# Patient Record
Sex: Male | Born: 1955 | Race: White | Hispanic: No | Marital: Married | Smoking: Current every day smoker
Health system: Southern US, Community
[De-identification: ages and names within clinical notes are randomized; demographics above are authoritative.]

## PROBLEM LIST (undated history)

## (undated) DIAGNOSIS — T7840XA Allergy, unspecified, initial encounter: Secondary | ICD-10-CM

## (undated) DIAGNOSIS — F419 Anxiety disorder, unspecified: Secondary | ICD-10-CM

## (undated) HISTORY — DX: Anxiety disorder, unspecified: F41.9

## (undated) HISTORY — PX: KIDNEY STONE SURGERY: SHX686

## (undated) HISTORY — DX: Allergy, unspecified, initial encounter: T78.40XA

---

## 2002-05-28 ENCOUNTER — Encounter: Payer: Self-pay | Admitting: Emergency Medicine

## 2002-05-28 ENCOUNTER — Emergency Department (HOSPITAL_COMMUNITY): Admission: EM | Admit: 2002-05-28 | Discharge: 2002-05-28 | Payer: Self-pay | Admitting: Emergency Medicine

## 2003-07-23 ENCOUNTER — Ambulatory Visit (HOSPITAL_COMMUNITY): Admission: RE | Admit: 2003-07-23 | Discharge: 2003-07-23 | Payer: Self-pay | Admitting: Family Medicine

## 2003-07-24 ENCOUNTER — Ambulatory Visit (HOSPITAL_COMMUNITY): Admission: RE | Admit: 2003-07-24 | Discharge: 2003-07-24 | Payer: Self-pay | Admitting: Family Medicine

## 2004-10-17 ENCOUNTER — Ambulatory Visit (HOSPITAL_COMMUNITY): Admission: RE | Admit: 2004-10-17 | Discharge: 2004-10-17 | Payer: Self-pay | Admitting: Family Medicine

## 2008-03-07 ENCOUNTER — Ambulatory Visit (HOSPITAL_COMMUNITY): Admission: EM | Admit: 2008-03-07 | Discharge: 2008-03-07 | Payer: Self-pay | Admitting: Emergency Medicine

## 2010-09-13 ENCOUNTER — Encounter: Payer: Self-pay | Admitting: Otolaryngology

## 2010-10-17 ENCOUNTER — Emergency Department (HOSPITAL_COMMUNITY): Payer: Self-pay

## 2010-10-17 ENCOUNTER — Emergency Department (HOSPITAL_COMMUNITY)
Admission: EM | Admit: 2010-10-17 | Discharge: 2010-10-18 | Discharge status: Against Medical Advice | Payer: Self-pay | Attending: Emergency Medicine | Admitting: Emergency Medicine

## 2010-10-17 DIAGNOSIS — R42 Dizziness and giddiness: Secondary | ICD-10-CM | POA: Insufficient documentation

## 2010-10-17 DIAGNOSIS — R51 Headache: Secondary | ICD-10-CM | POA: Insufficient documentation

## 2010-10-17 DIAGNOSIS — R112 Nausea with vomiting, unspecified: Secondary | ICD-10-CM | POA: Insufficient documentation

## 2010-10-17 DIAGNOSIS — Z79899 Other long term (current) drug therapy: Secondary | ICD-10-CM | POA: Insufficient documentation

## 2010-10-17 DIAGNOSIS — J3489 Other specified disorders of nose and nasal sinuses: Secondary | ICD-10-CM | POA: Insufficient documentation

## 2010-10-17 LAB — CBC
HCT: 44 % (ref 39.0–52.0)
Hemoglobin: 15.4 g/dL (ref 13.0–17.0)
RBC: 4.78 MIL/uL (ref 4.22–5.81)
RDW: 13.8 % (ref 11.5–15.5)
WBC: 12 10*3/uL — ABNORMAL HIGH (ref 4.0–10.5)

## 2010-10-17 LAB — DIFFERENTIAL
Basophils Absolute: 0.1 10*3/uL (ref 0.0–0.1)
Lymphocytes Relative: 27 % (ref 12–46)
Neutro Abs: 7.7 10*3/uL (ref 1.7–7.7)
Neutrophils Relative %: 64 % (ref 43–77)

## 2010-10-17 LAB — POCT I-STAT, CHEM 8
Chloride: 105 mEq/L (ref 96–112)
HCT: 48 % (ref 39.0–52.0)
Hemoglobin: 16.3 g/dL (ref 13.0–17.0)
Potassium: 4.5 mEq/L (ref 3.5–5.1)
Sodium: 139 mEq/L (ref 135–145)

## 2010-10-20 ENCOUNTER — Other Ambulatory Visit (HOSPITAL_COMMUNITY): Payer: Self-pay

## 2010-10-20 ENCOUNTER — Ambulatory Visit (HOSPITAL_COMMUNITY): Admission: RE | Admit: 2010-10-20 | Payer: Self-pay | Source: Ambulatory Visit

## 2010-10-23 LAB — B. BURGDORFI ANTIBODIES BY WB
B burgdorferi IgG Abs (IB): NEGATIVE
B burgdorferi IgM Abs (IB): NEGATIVE

## 2011-01-06 NOTE — Op Note (Signed)
NAMESEYON, Luke Franco               ACCOUNT NO.:  [id number]   MEDICAL RECORD NO.:  [id number]           PATIENT TYPE:   LOCATION:                                 FACILITY:   PHYSICIAN:  Artist Pais. Weingold, M.D.DATE OF BIRTH:  12/05/1955   DATE OF PROCEDURE:  03/07/2008  DATE OF DISCHARGE:                               OPERATIVE REPORT   PREOPERATIVE DIAGNOSIS:  Left index finger tip amputation with exposed  distal phalangeal bone.   POSTOPERATIVE DIAGNOSIS:  Left index finger tip amputation with exposed  distal phalangeal bone.   ANESTHESIA:  Irrigation and debridement of above with skeletal  shortening and Kutler V-Y flap coverage, left index finger tip  amputation.   SURGEON:  Artist Pais. Mina Marble, MD   ASSISTANT:  None.   Anesthesia was monitored anesthesia care (IV sedation with 0.25% plain  Marcaine digital block).   TOURNIQUET TIME:  80 minutes.   No complications.   No drains.   OPERATIVE REPORT:  The patient was taken to the operating suite.  After  the induction of adequate IV sedation, the patient's left upper  extremity was prepped and draped in sterile fashion.  An Esmarch was  used to exsanguinate the limb.  Tourniquet was then inflated to 250  mmHg.  At this point in time, the patient's left index finger was  debrided.  A liter of normal saline was used to irrigate any devitalized  tissue out.  There was a complex laceration in the nail bed.  This was  transected with a 15 blade until a horizontal surface was created.  The  edge of the distal phalanx that had been fractured was carefully  rongeured down to again a horizontal flat surface using a rongeur.  Once  this was done, there was a small bit of the sterile matrix left.  The  DuraMatrix was intact.  At this point in time, Kutler V-Y lateral flap  was fashioned and raised and taken across into the distal phalanx and  sutured from radial to ulnar, thus covering the exposed distal  phalangeal bone.   This was sutured using 4-0 Vicryl Rapide.  At the end  of the procedure, the patient had no exposed distal phalangeal bone,  appeared to be fairly anatomic-looking eponychial fold and distal fold  round the remaining nail bed.  The patient was dressed with Xeroform, 4  x 4's, and Coban wrap.  The patient tolerated the procedure well and  went to recovery room in stable fashion.      Artist Pais Mina Marble, M.D.  Electronically Signed    MAW/MEDQ  D:  03/07/2008  T:  03/08/2008  Job:  [id number]

## 2011-01-06 NOTE — Consult Note (Signed)
NAMECULLEN, Luke Franco               ACCOUNT NO.:  [id number]   MEDICAL RECORD NO.:  [id number]          PATIENT TYPE:  AMB   LOCATION:  [facility]                         FACILITY:  [facility]   PHYSICIAN:  Artist Pais. Weingold, M.D.DATE OF BIRTH:  [birth year]   DATE OF CONSULTATION:  03/07/2008  DATE OF DISCHARGE:  03/07/2008                                 CONSULTATION   ER CONSULTATION   REQUESTING PHYSICIAN:  Luke Hutching, MD   REASON FOR CONSULTATION:  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  He is an, otherwise,  healthy 55 year old male.  He has no known drug allergies.  He is taking  no current medications.  He smokes 2 packs a day, occasional alcohol  use.  No significant family medical history.   Examination reveals a well-developed, well-nourished male.  Pleasant,  alert, and oriented x3.  He has mild distress and mild discomfort on his  index finger left.  He has a nail plate avulsion, nail bed laceration,  exposed distal phalangeal bone with an amputation through the middle of  the distal phalanx with exposed distal phalangeal bone.   X-rays are consistent with this injury, distal phalangeal fracture.  At  this point in time, I had a thorough and frank discussion with Mr.  Franco regarding treatment options.  Treatment options include revision  of the amputation with skeletal shortening and local flap coverage  versus cross finger flap to the long finger.  The patient is desirous to  get back to work as soon as possible.  Therefore, we will proceed with  skeletal shortening and local Cutler V-Y flap to cover the distal  phalangeal bone.  We will do this in the operating room under IV  sedation and local block at the  patient's request.      Molli Hazard A. Mina Marble, M.D.  Electronically Signed     MAW/MEDQ  D:  03/07/2008  T:  03/08/2008  Job:  [id number]

## 2011-05-22 LAB — POCT I-STAT 4, (NA,K, GLUC, HGB,HCT)
Operator id: 173791
Potassium: 3.8
Sodium: 139

## 2011-11-12 ENCOUNTER — Encounter: Payer: Self-pay | Admitting: Family Medicine

## 2011-11-12 ENCOUNTER — Ambulatory Visit: Payer: Self-pay

## 2011-11-12 ENCOUNTER — Ambulatory Visit: Payer: Self-pay | Admitting: Family Medicine

## 2011-11-12 VITALS — BP 138/87 | HR 91 | Temp 97.5°F | Resp 16 | Ht 70.5 in | Wt 178.4 lb

## 2011-11-12 DIAGNOSIS — R61 Generalized hyperhidrosis: Secondary | ICD-10-CM

## 2011-11-12 MED ORDER — ALPRAZOLAM 0.25 MG PO TABS: 0.2500 mg | ORAL_TABLET | Freq: Every evening | ORAL | Status: AC | PRN

## 2011-11-12 NOTE — Progress Notes (Signed)
S:  Night sweats x 2 months, intermittently.  Relieved by xanax .25   Stressed being out of work.  O:  HEENT: neg Chest:  Clear Heart:  Reg, no murmur Abdomen:  No HSM, nontender Neck, supraclav, axilla: no adenop  [facility] reading (PRIMARY) by  Dr. Milus Glazier  Neg CXR  A:  Anxiety related sx  P:  Xanax .25

## 2011-12-11 ENCOUNTER — Ambulatory Visit: Payer: Self-pay | Admitting: Family Medicine

## 2011-12-11 ENCOUNTER — Encounter: Payer: Self-pay | Admitting: Family Medicine

## 2011-12-11 VITALS — BP 128/79 | HR 80 | Temp 97.4°F | Resp 16 | Ht 71.5 in | Wt 179.6 lb

## 2011-12-11 DIAGNOSIS — R5383 Other fatigue: Secondary | ICD-10-CM

## 2011-12-11 DIAGNOSIS — R5381 Other malaise: Secondary | ICD-10-CM

## 2011-12-11 NOTE — Patient Instructions (Signed)
Lyme Disease  You may have been bitten by a tick and are to watch for the development of Lyme Disease. Lyme Disease is an infection that is caused by a bacteria The bacteria causing this disease is named Borreilia burgdorferi. If a tick is infected with this bacteria and then bites you, then Lyme Disease may occur. These ticks are carried by deer and rodents such as rabbits and mice and infest grassy as well as forested areas. Fortunately most tick bites do not cause Lyme Disease.   Lyme Disease is easier to prevent than to treat. First, covering your legs with clothing when walking in areas where ticks are possibly abundant will prevent their attachment because ticks tend to stay within inches of the ground. Second, using insecticides containing DEET can be applied on skin or clothing. Last, because it takes about 12 to 24 hours for the tick to transmit the disease after attachment to the human host, you should inspect your body for ticks twice a day when you are in areas where Lyme Disease is common. You must look thoroughly when searching for ticks. The Ixodes tick that carries Lyme Disease is very small. It is around the size of a sesame seed (picture of tick is not actual size). Removal is best done by grasping the tick by the head and pulling it out. Do not to squeeze the body of the tick. This could inject the infecting bacteria into the bite site. Wash the area of the bite with an antiseptic solution after removal.   Lyme Disease is a disease that may affect many body systems. Because of the small size of the biting tick, most people do not notice being bitten. The first sign of an infection is usually a round red rash that extends out from the center of the tick bite. The center of the lesion may be blood colored (hemorrhagic) or have tiny blisters (vesicular). Most lesions have bright red outer borders and partial central clearing. This rash may extend out many inches in diameter, and multiple lesions may  be present. Other symptoms such as fatigue, headaches, chills and fever, general achiness and swelling of lymph glands may also occur. If this first stage of the disease is left untreated, these symptoms may gradually resolve by themselves, or progressive symptoms may occur because of spread of infection to other areas of the body.   Follow up with your caregiver to have testing and treatment if you have a tick bite and you develop any of the above complaints. Your caregiver may recommend preventative (prophylactic) medications which kill bacteria (antibiotics). Once a diagnosis of Lyme Disease is made, antibiotic treatment is highly likely to cure the disease. Effective treatment of late stage Lyme Disease may require longer courses of antibiotic therapy.   MAKE SURE YOU:    Understand these instructions.   Will watch your condition.   Will get help right away if you are not doing well or get worse.  Document Released: 11/16/2000 Document Revised: 07/30/2011 Document Reviewed: 01/18/2009  [employer] Patient Information 2012 [employer].

## 2011-12-12 LAB — CBC
HCT: 45.1 % (ref 39.0–52.0)
Hemoglobin: 15.1 g/dL (ref 13.0–17.0)
MCH: 31.3 pg (ref 26.0–34.0)
MCHC: 33.5 g/dL (ref 30.0–36.0)
MCV: 93.4 fL (ref 78.0–100.0)
Platelets: 249 10*3/uL (ref 150–400)
RBC: 4.83 MIL/uL (ref 4.22–5.81)
RDW: 13.9 % (ref 11.5–15.5)
WBC: 7.7 10*3/uL (ref 4.0–10.5)

## 2011-12-12 LAB — COMPREHENSIVE METABOLIC PANEL
ALT: 20 U/L (ref 0–53)
AST: 20 U/L (ref 0–37)
Albumin: 4.6 g/dL (ref 3.5–5.2)
Alkaline Phosphatase: 84 U/L (ref 39–117)
BUN: 10 mg/dL (ref 6–23)
CO2: 24 mEq/L (ref 19–32)
Calcium: 9.6 mg/dL (ref 8.4–10.5)
Chloride: 105 mEq/L (ref 96–112)
Creat: 0.98 mg/dL (ref 0.50–1.35)
Glucose, Bld: 91 mg/dL (ref 70–99)
Potassium: 4.2 mEq/L (ref 3.5–5.3)
Sodium: 138 mEq/L (ref 135–145)
Total Bilirubin: 0.4 mg/dL (ref 0.3–1.2)
Total Protein: 7.2 g/dL (ref 6.0–8.3)

## 2011-12-12 LAB — TSH: TSH: 0.846 u[IU]/mL (ref 0.350–4.500)

## 2011-12-12 NOTE — Progress Notes (Signed)
Is a 56 year old married man with a history of Lyme disease several years ago. Comes in with recurrence of fatigue over the last month. His like to have this evaluated in case there is a recurrence of Lyme disease. He denies any rash or joint pains at this time. He's had no headaches or fever.  Objective HEENT: Unremarkable patient is in no acute distress or wrap chest: Clear  Heart: Regular no murmur  Abdomen: No rash at the previous site, HSM unenlarged, no masses or tenderness  Extremities: Unremarkable  Assessment: Patient's recently returned to work and some of the fatigue may have something to do with this. His wife is chronically ill and is also a stressor.  Plan: Check labs to rule out underlying causes of fatigue.

## 2011-12-14 LAB — B. BURGDORFI ANTIBODIES: B burgdorferi Ab IgG+IgM: 0.67 {ISR}

## 2012-01-29 ENCOUNTER — Ambulatory Visit: Payer: Self-pay | Admitting: Family Medicine

## 2012-01-29 VITALS — BP 128/80 | HR 67 | Temp 98.1°F | Resp 16 | Ht 71.0 in | Wt 182.0 lb

## 2012-01-29 DIAGNOSIS — S20169A Insect bite (nonvenomous) of breast, unspecified breast, initial encounter: Secondary | ICD-10-CM

## 2012-01-29 DIAGNOSIS — N4822 Cellulitis of corpus cavernosum and penis: Secondary | ICD-10-CM

## 2012-01-29 DIAGNOSIS — L089 Local infection of the skin and subcutaneous tissue, unspecified: Secondary | ICD-10-CM

## 2012-01-29 NOTE — Progress Notes (Signed)
Is a 56 year old man who found a tick on his penis, the frenulum part, 40s ago. He is able to smaller with petroleum jelly and then eventually extricate the pincers. He's had a great amount of itching, localized redness and crusty exudate at the area of the bite.  Objective: Mild cellulitis the frenulum of the ventral penis. Minimal erythema as well as crusting.     assessment: Tick bite with localized cellulitis  Plan: Doxycycline 100 mg twice a day x10 days

## 2012-03-16 ENCOUNTER — Telehealth: Payer: Self-pay

## 2012-03-16 NOTE — Telephone Encounter (Signed)
Please contact pt wife she only wants to speak to Dr Elbert Ewings [phone]

## 2012-03-17 NOTE — Telephone Encounter (Signed)
Patient's wife called back again and only wants to speak to Dr L. She states Dr. Elbert Ewings  cannot be her physician anymore, but she would like him to see her husband still.  Maryelizabeth Rowan [phone]

## 2012-11-07 ENCOUNTER — Ambulatory Visit: Payer: Self-pay | Admitting: Physician Assistant

## 2012-11-07 VITALS — BP 120/70 | HR 64 | Temp 98.5°F | Resp 16 | Ht 71.0 in | Wt 162.0 lb

## 2012-11-07 DIAGNOSIS — J069 Acute upper respiratory infection, unspecified: Secondary | ICD-10-CM

## 2012-11-07 MED ORDER — CETIRIZINE HCL 10 MG PO TABS: 10.0000 mg | ORAL_TABLET | Freq: Every day | ORAL | Status: DC

## 2012-11-07 MED ORDER — BENZONATATE 100 MG PO CAPS: 100.0000 mg | ORAL_CAPSULE | Freq: Three times a day (TID) | ORAL | Status: DC | PRN

## 2012-11-07 MED ORDER — IPRATROPIUM BROMIDE 0.03 % NA SOLN: 2.0000 | Freq: Two times a day (BID) | NASAL | Status: DC

## 2012-11-07 NOTE — Patient Instructions (Addendum)
Begin using the Zyrtec (cetirizine) once daily to help with nasal congestion and post-nasal drainage.  Atrovent nasal spray twice daily to help relieve nasal congestion and ear pressure.  Tessalon Perles for cough if needed.  Plenty of fluids (water is best!) and rest.  If you are worsening (fever >101.81F, worsening pain, worsening cough) or not improving in 4-5 days, please call us and we can send an antibiotic in for you.   Upper Respiratory Infection, Adult An upper respiratory infection (URI) is also sometimes known as the common cold. The upper respiratory tract includes the nose, sinuses, throat, trachea, and bronchi. Bronchi are the airways leading to the lungs. Most people improve within 1 week, but symptoms can last up to 2 weeks. A residual cough may last even longer.  CAUSES Many different viruses can infect the tissues lining the upper respiratory tract. The tissues become irritated and inflamed and often become very moist. Mucus production is also common. A cold is contagious. You can easily spread the virus to others by oral contact. This includes kissing, sharing a glass, coughing, or sneezing. Touching your mouth or nose and then touching a surface, which is then touched by another person, can also spread the virus. SYMPTOMS  Symptoms typically develop 1 to 3 days after you come in contact with a cold virus. Symptoms vary from person to person. They may include:  Runny nose.  Sneezing.  Nasal congestion.  Sinus irritation.  Sore throat.  Loss of voice (laryngitis).  Cough.  Fatigue.  Muscle aches.  Loss of appetite.  Headache.  Low-grade fever. DIAGNOSIS  You might diagnose your own cold based on familiar symptoms, since most people get a cold 2 to 3 times a year. Your caregiver can confirm this based on your exam. Most importantly, your caregiver can check that your symptoms are not due to another disease such as strep throat, sinusitis, pneumonia, asthma, or  epiglottitis. Blood tests, throat tests, and X-rays are not necessary to diagnose a common cold, but they may sometimes be helpful in excluding other more serious diseases. Your caregiver will decide if any further tests are required. RISKS AND COMPLICATIONS  You may be at risk for a more severe case of the common cold if you smoke cigarettes, have chronic heart disease (such as heart failure) or lung disease (such as asthma), or if you have a weakened immune system. The very young and very old are also at risk for more serious infections. Bacterial sinusitis, middle ear infections, and bacterial pneumonia can complicate the common cold. The common cold can worsen asthma and chronic obstructive pulmonary disease (COPD). Sometimes, these complications can require emergency medical care and may be life-threatening. PREVENTION  The best way to protect against getting a cold is to practice good hygiene. Avoid oral or hand contact with people with cold symptoms. Wash your hands often if contact occurs. There is no clear evidence that vitamin C, vitamin E, echinacea, or exercise reduces the chance of developing a cold. However, it is always recommended to get plenty of rest and practice good nutrition. TREATMENT  Treatment is directed at relieving symptoms. There is no cure. Antibiotics are not effective, because the infection is caused by a virus, not by bacteria. Treatment may include:  Increased fluid intake. Sports drinks offer valuable electrolytes, sugars, and fluids.  Breathing heated mist or steam (vaporizer or shower).  Eating chicken soup or other clear broths, and maintaining good nutrition.  Getting plenty of rest.  Using gargles or  lozenges for comfort.  Controlling fevers with ibuprofen or acetaminophen as directed by your caregiver.  Increasing usage of your inhaler if you have asthma. Zinc gel and zinc lozenges, taken in the first 24 hours of the common cold, can shorten the duration  and lessen the severity of symptoms. Pain medicines may help with fever, muscle aches, and throat pain. A variety of non-prescription medicines are available to treat congestion and runny nose. Your caregiver can make recommendations and may suggest nasal or lung inhalers for other symptoms.  HOME CARE INSTRUCTIONS   Only take over-the-counter or prescription medicines for pain, discomfort, or fever as directed by your caregiver.  Use a warm mist humidifier or inhale steam from a shower to increase air moisture. This may keep secretions moist and make it easier to breathe.  Drink enough water and fluids to keep your urine clear or pale yellow.  Rest as needed.  Return to work when your temperature has returned to normal or as your caregiver advises. You may need to stay home longer to avoid infecting others. You can also use a face mask and careful hand washing to prevent spread of the virus. SEEK MEDICAL CARE IF:   After the first few days, you feel you are getting worse rather than better.  You need your caregiver's advice about medicines to control symptoms.  You develop chills, worsening shortness of breath, or brown or red sputum. These may be signs of pneumonia.  You develop yellow or brown nasal discharge or pain in the face, especially when you bend forward. These may be signs of sinusitis.  You develop a fever, swollen neck glands, pain with swallowing, or white areas in the back of your throat. These may be signs of strep throat. SEEK IMMEDIATE MEDICAL CARE IF:   You have a fever.  You develop severe or persistent headache, ear pain, sinus pain, or chest pain.  You develop wheezing, a prolonged cough, cough up blood, or have a change in your usual mucus (if you have chronic lung disease).  You develop sore muscles or a stiff neck. Document Released: 02/03/2001 Document Revised: 11/02/2011 Document Reviewed: 12/12/2010 [facility] Patient Information 2013 [place], [place].

## 2012-11-07 NOTE — Progress Notes (Signed)
  Subjective:    Patient ID: JAYSTON TREVINO, male    DOB: 13-Sep-1955, 58 y.o.   MRN: [id number]  HPI  Mr. Grabill is a very pleasant 57 yr old male here with concern for sinusitis.  States he has been told that he has chronic sinusitis.  Would like "pills and a shot" today. Currently experiencing itchy throat, popping ears, and nasal congestion.  Also with some non-productive cough.  Chills but no fever.  A little nausea and decreased appetite but no vomiting or diarrhea.  Symptoms have been present for 2 days.  No known sick contacts.  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  He currently smokes 1ppd (down from 2 ppd for many years).      Review of Systems  Constitutional: Positive for chills. Negative for fever.  HENT: Positive for ear pain, congestion, sore throat and rhinorrhea.   Respiratory: Positive for cough and wheezing. Negative for shortness of breath.   Cardiovascular: Negative.   Gastrointestinal: Negative.   Skin: Negative.   Neurological: Negative.        Objective:   Physical Exam  Vitals reviewed. Constitutional: He is oriented to person, place, and time. He appears well-developed and well-nourished. No distress.  HENT:  Head: Normocephalic and atraumatic.  Right Ear: Ear canal normal. Tympanic membrane is not erythematous and not bulging. A middle ear effusion is present.  Left Ear: Tympanic membrane and ear canal normal.  Nose: Mucosal edema present. Right sinus exhibits no maxillary sinus tenderness and no frontal sinus tenderness. Left sinus exhibits no maxillary sinus tenderness and no frontal sinus tenderness.  Mouth/Throat: Uvula is midline, oropharynx is clear and moist and mucous membranes are normal.  Eyes: Conjunctivae are normal. No scleral icterus.  Neck: Neck supple.  Cardiovascular: Normal rate, regular rhythm and normal heart sounds.  Exam reveals no gallop and no friction rub.   No murmur heard. Pulmonary/Chest: Effort  normal and breath sounds normal. He has no wheezes. He has no rales.  Lymphadenopathy:    He has no cervical adenopathy.  Neurological: He is alert and oriented to person, place, and time.  Skin: Skin is warm and dry.  Psychiatric: He has a normal mood and affect. His behavior is normal.     Filed Vitals:   11/07/12 1257  BP: 120/70  Pulse: 64  Temp: 98.5 F (36.9 C)  Resp: 16        Assessment & Plan:  Upper respiratory infection - Plan: cetirizine (ZYRTEC) 10 MG tablet, ipratropium (ATROVENT) 0.03 % nasal spray, benzonatate (TESSALON) 100 MG capsule   Mr. Janoski is a very pleasant 57 yr old male here with upper respiratory infection.  He is afebrile, VSS, lungs CTA, no sinus tenderness.  Symptoms have been present for 2 days.  I do not think antibiotics are warranted at this point.  Pt is very disappointed to hear this.  Discussed with him the guidelines for diagnosing bacterial sinusitis, and that this is likely viral and that antibiotics will not improve symptoms.  Will treat symptoms with cetirizine, atrovent, and tessalon.  Push fluids, plenty of rest.  If pt is worsening or not improving in 4-5 days, he will call back and we can send Augmentin BID x 10 days for sinusitis.  Pt understands and is grateful for this.

## 2013-06-30 ENCOUNTER — Ambulatory Visit: Payer: Self-pay | Admitting: Family Medicine

## 2013-06-30 VITALS — BP 134/80 | HR 76 | Temp 98.1°F | Resp 18 | Ht 72.0 in | Wt 151.8 lb

## 2013-06-30 DIAGNOSIS — W57XXXA Bitten or stung by nonvenomous insect and other nonvenomous arthropods, initial encounter: Secondary | ICD-10-CM

## 2013-06-30 DIAGNOSIS — Z720 Tobacco use: Secondary | ICD-10-CM | POA: Insufficient documentation

## 2013-06-30 DIAGNOSIS — T148 Other injury of unspecified body region: Secondary | ICD-10-CM

## 2013-06-30 MED ORDER — DOXYCYCLINE HYCLATE 100 MG PO TABS: 100.0000 mg | ORAL_TABLET | Freq: Two times a day (BID) | ORAL | Status: DC

## 2013-06-30 NOTE — Patient Instructions (Signed)
Use the doxycycline as directed for possible lyme disease.  Let us know if you are not feeling better soon

## 2013-06-30 NOTE — Progress Notes (Signed)
[facility] [address], [place] [address] [phone]  Date:  06/30/2013   Name:  Luke Franco   DOB:  11-Feb-1956   MRN:  [id number]  PCP:  Elvina Sidle, MD    Chief Complaint: Tick Removal   History of Present Illness:  Luke Franco is a 57 y.o. very pleasant male patient who presents with the following:  He noted a tick on his left chest this morning.  He is not sure how long it has ben there.   He has felt "kind of funny" for 3 or 4 days to a week.  He has felt nauseated, no vomiting.  No fever.  No has not noted any rash He has had lyme disease in the past apparently about 2 years ago He notes a mild headache and feels a little bit dizzy.  There are no active problems to display for this patient.   History reviewed. No pertinent past medical history.  History reviewed. No pertinent past surgical history.  History  Substance Use Topics  . Smoking status: Current Every Day Smoker -- 1.00 packs/day for 25 years    Types: Cigarettes  . Smokeless tobacco: Not on file  . Alcohol Use: No    Family History  Problem Relation Age of Onset  . Diabetes Mother   . Diabetes Brother     No Known Allergies  Medication list has been reviewed and updated.  Current Outpatient Prescriptions on File Prior to Visit  Medication Sig Dispense Refill  . benzonatate (TESSALON) 100 MG capsule Take 1-2 capsules (100-200 mg total) by mouth 3 (three) times daily as needed for cough.  40 capsule  0  . cetirizine (ZYRTEC) 10 MG tablet Take 1 tablet (10 mg total) by mouth daily.  30 tablet  11  . ipratropium (ATROVENT) 0.03 % nasal spray Place 2 sprays into the nose 2 (two) times daily.  30 mL  1   No current facility-administered medications on file prior to visit.    Review of Systems:  As per HPI- otherwise negative.   Physical Examination: Filed Vitals:   06/30/13 1754  BP: 134/80  Pulse: 76  Temp: 98.1 F (36.7 C)  Resp: 18   Filed  Vitals:   06/30/13 1754  Height: 6' (1.829 m)  Weight: 151 lb 12.8 oz (68.856 kg)   Body mass index is 20.58 kg/(m^2). Ideal Body Weight: Weight in (lb) to have BMI = 25: 183.9  GEN: WDWN, NAD, Non-toxic, A & O x 3, thin, looks well but smells strongly of tobacco HEENT: Atraumatic, Normocephalic. Neck supple. No masses, No LAD.  Bilateral TM wnl, oropharynx normal.  PEERL,EOMI.   Ears and Nose: No external deformity. CV: RRR, No M/G/R. No JVD. No thrill. No extra heart sounds. PULM: CTA B, no wheezes, crackles, rhonchi. No retractions. No resp. distress. No accessory muscle use. ABD: S, NT, ND EXTR: No c/c/e NEURO Normal gait. Normal UE and LE strength, sensation and DTR.   PSYCH: Normally interactive. Conversant. Not depressed or anxious appearing.  Calm demeanor.  posible small tick bite in his left chest.    Assessment and Plan: Tick bite - Plan: doxycycline (VIBRA-TABS) 100 MG tablet  Pt is concerned about lyme disease.   Although true lyme is unlikely given our geographical location testing is expensive, he is uninsured and is concerned about lyme.  Will treat with doxycycline for 2 weeks.  He is to let us know if not feeling better  soon- Sooner if worse.     Signed Abbe AmsterdamJessica Enoch Moffa, MD

## 2013-07-25 ENCOUNTER — Ambulatory Visit: Payer: Self-pay | Admitting: Family Medicine

## 2013-07-25 VITALS — BP 128/70 | HR 65 | Temp 98.2°F | Resp 18 | Ht 72.0 in | Wt 152.0 lb

## 2013-07-25 DIAGNOSIS — J329 Chronic sinusitis, unspecified: Secondary | ICD-10-CM

## 2013-07-25 DIAGNOSIS — J209 Acute bronchitis, unspecified: Secondary | ICD-10-CM

## 2013-07-25 MED ORDER — HYDROCODONE-HOMATROPINE 5-1.5 MG/5ML PO SYRP: 5.0000 mL | ORAL_SOLUTION | Freq: Three times a day (TID) | ORAL | Status: DC | PRN

## 2013-07-25 MED ORDER — AMOXICILLIN 875 MG PO TABS: 875.0000 mg | ORAL_TABLET | Freq: Two times a day (BID) | ORAL | Status: DC

## 2013-07-25 NOTE — Progress Notes (Signed)
Patient ID: ORVELL CAREAGA MRN: [id number], DOB: November 17, 1955, 57 y.o. Date of Encounter: 07/25/2013, 3:34 PM  Primary Physician: Elvina Sidle, MD  Chief Complaint:  Chief Complaint  Patient presents with  . flu like sx    headaches, chills, cough, itchy throat, dizziness behind eyes, head pressure since friday     HPI: 57 y.o. year old male presents with a 4 day history of nasal congestion, post nasal drip, sore throat, and cough. Mild sinus pressure. Afebrile. No chills. Nasal congestion thick and green/yellow. Cough is productive of green/yellow sputum and not associated with time of day. Ears feel full, leading to sensation of muffled hearing. Has tried OTC cold preps without success. No GI complaints.   No sick contacts, recent antibiotics, or recent travels.   No leg trauma, sedentary periods, h/o cancer, or tobacco use.  History reviewed. No pertinent past medical history.   Home Meds: Prior to Admission medications   Medication Sig Start Date End Date Taking? Authorizing Provider  benzonatate (TESSALON) 100 MG capsule Take 1-2 capsules (100-200 mg total) by mouth 3 (three) times daily as needed for cough. 11/07/12   Eleanore Delia Chimes, PA-C  cetirizine (ZYRTEC) 10 MG tablet Take 1 tablet (10 mg total) by mouth daily. 11/07/12   Eleanore Delia Chimes, PA-C  doxycycline (VIBRA-TABS) 100 MG tablet Take 1 tablet (100 mg total) by mouth 2 (two) times daily. 06/30/13   Gwenlyn Found Copland, MD  ipratropium (ATROVENT) 0.03 % nasal spray Place 2 sprays into the nose 2 (two) times daily. 11/07/12   Eleanore Delia Chimes, PA-C    Allergies: No Known Allergies  History   Social History  . Marital Status: Married    Spouse Name: N/A    Number of Children: N/A  . Years of Education: N/A   Occupational History  . Not on file.   Social History Main Topics  . Smoking status: Current Every Day Smoker -- 1.00 packs/day for 25 years    Types: Cigarettes  . Smokeless tobacco: Not on file  . Alcohol  Use: No  . Drug Use: Not on file  . Sexual Activity: Not on file   Other Topics Concern  . Not on file   Social History Narrative  . No narrative on file     Review of Systems: Constitutional: negative for chills, fever, night sweats or weight changes Cardiovascular: negative for chest pain or palpitations Respiratory: negative for hemoptysis, wheezing, or shortness of breath Abdominal: negative for abdominal pain, nausea, vomiting or diarrhea Dermatological: negative for rash Neurologic: negative for headache   Physical Exam: Blood pressure 128/70, pulse 65, temperature 98.2 F (36.8 C), temperature source Oral, resp. rate 18, height 6' (1.829 m), weight 152 lb (68.947 kg), SpO2 97.00%., Body mass index is 20.61 kg/(m^2). General: Well developed, well nourished, in no acute distress. Head: Normocephalic, atraumatic, eyes without discharge, sclera non-icteric, nares are congested. Bilateral auditory canals clear, TM's are without perforation, pearly grey with reflective cone of light bilaterally. No sinus TTP. Oral cavity moist, dentition normal. Posterior pharynx with post nasal drip and mild erythema. No peritonsillar abscess or tonsillar exudate. Neck: Supple. No thyromegaly. Full ROM. No lymphadenopathy. Lungs: Coarse breath sounds bilaterally without wheezes, rales, or rhonchi. Breathing is unlabored.  Heart: RRR with S1 S2. No murmurs, rubs, or gallops appreciated. Msk:  Strength and tone normal for age. Extremities: No clubbing or cyanosis. No edema. Neuro: Alert and oriented X 3. Moves all extremities spontaneously. CNII-XII grossly in tact. Psych:  Responds to questions appropriately with a normal affect.     ASSESSMENT AND PLAN:  57 y.o. year old male with bronchitis. Acute bronchitis - Plan: amoxicillin (AMOXIL) 875 MG tablet  - -Mucinex -Tylenol/Motrin prn -Rest/fluids -RTC precautions -RTC 3-5 days if no improvement  Signed, Elvina Sidle,  MD 07/25/2013 3:34 PM

## 2013-07-25 NOTE — Patient Instructions (Signed)
Sinusitis Sinusitis is redness, soreness, and swelling (inflammation) of the paranasal sinuses. Paranasal sinuses are air pockets within the bones of your face (beneath the eyes, the middle of the forehead, or above the eyes). In healthy paranasal sinuses, mucus is able to drain out, and air is able to circulate through them by way of your nose. However, when your paranasal sinuses are inflamed, mucus and air can become trapped. This can allow bacteria and other germs to grow and cause infection. Sinusitis can develop quickly and last only a short time (acute) or continue over a long period (chronic). Sinusitis that lasts for more than 12 weeks is considered chronic.  CAUSES  Causes of sinusitis include:  Allergies.  Structural abnormalities, such as displacement of the cartilage that separates your nostrils (deviated septum), which can decrease the air flow through your nose and sinuses and affect sinus drainage.  Functional abnormalities, such as when the small hairs (cilia) that line your sinuses and help remove mucus do not work properly or are not present. SYMPTOMS  Symptoms of acute and chronic sinusitis are the same. The primary symptoms are pain and pressure around the affected sinuses. Other symptoms include:  Upper toothache.  Earache.  Headache.  Bad breath.  Decreased sense of smell and taste.  A cough, which worsens when you are lying flat.  Fatigue.  Fever.  Thick drainage from your nose, which often is green and may contain pus (purulent).  Swelling and warmth over the affected sinuses. DIAGNOSIS  Your caregiver will perform a physical exam. During the exam, your caregiver may:  Look in your nose for signs of abnormal growths in your nostrils (nasal polyps).  Tap over the affected sinus to check for signs of infection.  View the inside of your sinuses (endoscopy) with a special imaging device with a light attached (endoscope), which is inserted into your  sinuses. If your caregiver suspects that you have chronic sinusitis, one or more of the following tests may be recommended:  Allergy tests.  Nasal culture A sample of mucus is taken from your nose and sent to a lab and screened for bacteria.  Nasal cytology A sample of mucus is taken from your nose and examined by your caregiver to determine if your sinusitis is related to an allergy. TREATMENT  Most cases of acute sinusitis are related to a viral infection and will resolve on their own within 10 days. Sometimes medicines are prescribed to help relieve symptoms (pain medicine, decongestants, nasal steroid sprays, or saline sprays).  However, for sinusitis related to a bacterial infection, your caregiver will prescribe antibiotic medicines. These are medicines that will help kill the bacteria causing the infection.  Rarely, sinusitis is caused by a fungal infection. In theses cases, your caregiver will prescribe antifungal medicine. For some cases of chronic sinusitis, surgery is needed. Generally, these are cases in which sinusitis recurs more than 3 times per year, despite other treatments. HOME CARE INSTRUCTIONS   Drink plenty of water. Water helps thin the mucus so your sinuses can drain more easily.  Use a humidifier.  Inhale steam 3 to 4 times a day (for example, sit in the bathroom with the shower running).  Apply a warm, moist washcloth to your face 3 to 4 times a day, or as directed by your caregiver.  Use saline nasal sprays to help moisten and clean your sinuses.  Take over-the-counter or prescription medicines for pain, discomfort, or fever only as directed by your caregiver. SEEK IMMEDIATE MEDICAL   CARE IF:  You have increasing pain or severe headaches.  You have nausea, vomiting, or drowsiness.  You have swelling around your face.  You have vision problems.  You have a stiff neck.  You have difficulty breathing. MAKE SURE YOU:   Understand these  instructions.  Will watch your condition.  Will get help right away if you are not doing well or get worse. Document Released: 08/10/2005 Document Revised: 11/02/2011 Document Reviewed: 08/25/2011 Exit[employer] Patient Information 2014 [employer].   Bronchitis Bronchitis is the body's way of reacting to injury and/or infection (inflammation) of the bronchi. Bronchi are the air tubes that extend from the windpipe into the lungs. If the inflammation becomes severe, it may cause shortness of breath. CAUSES  Inflammation may be caused by:  A virus.  Germs (bacteria).  Dust.  Allergens.  Pollutants and many other irritants. The cells lining the bronchial tree are covered with tiny hairs (cilia). These constantly beat upward, away from the lungs, toward the mouth. This keeps the lungs free of pollutants. When these cells become too irritated and are unable to do their job, mucus begins to develop. This causes the characteristic cough of bronchitis. The cough clears the lungs when the cilia are unable to do their job. Without either of these protective mechanisms, the mucus would settle in the lungs. Then you would develop pneumonia. Smoking is a common cause of bronchitis and can contribute to pneumonia. Stopping this habit is the single most important thing you can do to help yourself. TREATMENT   Your caregiver may prescribe an antibiotic if the cough is caused by bacteria. Also, medicines that open up your airways make it easier to breathe. Your caregiver may also recommend or prescribe an expectorant. It will loosen the mucus to be coughed up. Only take over-the-counter or prescription medicines for pain, discomfort, or fever as directed by your caregiver.  Removing whatever causes the problem (smoking, for example) is critical to preventing the problem from getting worse.  Cough suppressants may be prescribed for relief of cough symptoms.  Inhaled medicines may be prescribed to help  with symptoms now and to help prevent problems from returning.  For those with recurrent (chronic) bronchitis, there may be a need for steroid medicines. SEEK IMMEDIATE MEDICAL CARE IF:   During treatment, you develop more pus-like mucus (purulent sputum).  You have a fever.  You become progressively more ill.  You have increased difficulty breathing, wheezing, or shortness of breath. It is necessary to seek immediate medical care if you are elderly or sick from any other disease. MAKE SURE YOU:   Understand these instructions.  Will watch your condition.  Will get help right away if you are not doing well or get worse. Document Released: 08/10/2005 Document Revised: 04/12/2013 Document Reviewed: 04/04/2013 Exit[employer] Patient Information 2014 [employer].  

## 2013-07-31 ENCOUNTER — Ambulatory Visit: Payer: Self-pay

## 2013-07-31 ENCOUNTER — Ambulatory Visit: Payer: Self-pay | Admitting: Family Medicine

## 2013-07-31 VITALS — BP 128/70 | HR 75 | Temp 98.0°F | Resp 16 | Ht 70.0 in | Wt 153.0 lb

## 2013-07-31 DIAGNOSIS — J209 Acute bronchitis, unspecified: Secondary | ICD-10-CM

## 2013-07-31 DIAGNOSIS — R05 Cough: Secondary | ICD-10-CM

## 2013-07-31 DIAGNOSIS — R059 Cough, unspecified: Secondary | ICD-10-CM

## 2013-07-31 MED ORDER — METHYLPREDNISOLONE ACETATE 80 MG/ML IJ SUSP
120.0000 mg | Freq: Once | INTRAMUSCULAR | Status: AC
  Administered 2013-07-31: 120 mg via INTRAMUSCULAR

## 2013-07-31 NOTE — Progress Notes (Signed)
Patient ID: Luke Franco MRN: [id number], DOB: [birth year], 57 y.o. Date of Encounter: 07/31/2013, 12:01 PM  Primary Physician: Elvina Sidle, MD  Chief Complaint:  Chief Complaint  Patient presents with  . Follow-up    sinusitis    HPI: 57 y.o. year old male presents with a 14 day history of nasal congestion, post nasal drip, sore throat, and cough. Mild sinus pressure. Afebrile. No chills. Nasal congestion thick and green/yellow. Cough is productive of green/yellow sputum and not associated with time of day. Ears feel full, leading to sensation of muffled hearing. Has tried OTC cold preps without success. No GI complaints. Appetite good  No sick contacts, recent antibiotics, or recent travels.   No leg trauma, sedentary periods, h/o cancer, or tobacco use.  History reviewed. No pertinent past medical history.   Home Meds: Prior to Admission medications   Medication Sig Start Date End Date Taking? Authorizing Provider  amoxicillin (AMOXIL) 875 MG tablet Take 1 tablet (875 mg total) by mouth 2 (two) times daily. 07/25/13   Elvina Sidle, MD  HYDROcodone-homatropine [facility]) 5-1.5 MG/5ML syrup Take 5 mLs by mouth every 8 (eight) hours as needed for cough. 07/25/13   Elvina Sidle, MD    Allergies: No Known Allergies  History   Social History  . Marital Status: Married    Spouse Name: N/A    Number of Children: N/A  . Years of Education: N/A   Occupational History  . Not on file.   Social History Main Topics  . Smoking status: Current Every Day Smoker -- 1.00 packs/day for 25 years    Types: Cigarettes  . Smokeless tobacco: Not on file  . Alcohol Use: No  . Drug Use: Not on file  . Sexual Activity: Not on file   Other Topics Concern  . Not on file   Social History Narrative  . No narrative on file     Review of Systems: Constitutional: negative for chills, fever, night sweats or weight changes Cardiovascular: negative for chest pain or  palpitations Respiratory: negative for hemoptysis, wheezing, or shortness of breath Abdominal: negative for abdominal pain, nausea, vomiting or diarrhea Dermatological: negative for rash Neurologic: negative for headache   Physical Exam: Blood pressure 128/70, pulse 75, temperature 98 F (36.7 C), temperature source Oral, resp. rate 16, height 5\' 10"  (1.778 m), weight 153 lb (69.4 kg), SpO2 98.00%., Body mass index is 21.95 kg/(m^2). General: Well developed, well nourished, in no acute distress. Head: Normocephalic, atraumatic, eyes without discharge, sclera non-icteric, nares are congested. Bilateral auditory canals clear, TM's are without perforation, pearly grey with reflective cone of light bilaterally. No sinus TTP. Oral cavity moist, dentition normal. Posterior pharynx with post nasal drip and mild erythema. No peritonsillar abscess or tonsillar exudate. Neck: Supple. No thyromegaly. Full ROM. No lymphadenopathy. Lungs: Coarse breath sounds bilaterally with wheezes, no rales, or rhonchi. Breathing is unlabored.  Heart: RRR with S1 S2. No murmurs, rubs, or gallops appreciated. Msk:  Strength and tone normal for age. Extremities: No clubbing or cyanosis. No edema. Neuro: Alert and oriented X 3. Moves all extremities spontaneously. CNII-XII grossly in tact. Psych:  Responds to questions appropriately with a normal affect.   Labs: [facility] reading (PRIMARY) by  Dr. Milus Glazier:  peribronchial cuffing with hyperinflation.    ASSESSMENT AND PLAN:  57 y.o. year old male with bronchitis. Cough - Plan: DG Chest 2 View, methylPREDNISolone acetate (DEPO-MEDROL) injection 120 mg   - -Mucinex -Tylenol/Motrin prn -Rest/fluids -RTC precautions -RTC 3-5  days if no improvement  Signed, Elvina Sidle, MD 07/31/2013 12:01 PM

## 2013-08-02 ENCOUNTER — Encounter: Payer: Self-pay | Admitting: Family Medicine

## 2013-08-02 ENCOUNTER — Ambulatory Visit: Payer: Self-pay | Admitting: Family Medicine

## 2013-08-02 VITALS — BP 116/66 | HR 115 | Temp 98.2°F | Resp 18

## 2013-08-02 DIAGNOSIS — J209 Acute bronchitis, unspecified: Secondary | ICD-10-CM

## 2013-08-02 DIAGNOSIS — J449 Chronic obstructive pulmonary disease, unspecified: Secondary | ICD-10-CM

## 2013-08-02 MED ORDER — HYDROCODONE-HOMATROPINE 5-1.5 MG/5ML PO SYRP: 5.0000 mL | ORAL_SOLUTION | Freq: Three times a day (TID) | ORAL | Status: DC | PRN

## 2013-08-02 MED ORDER — METHYLPREDNISOLONE ACETATE 80 MG/ML IJ SUSP
80.0000 mg | Freq: Once | INTRAMUSCULAR | Status: AC
  Administered 2013-08-02: 80 mg via INTRAMUSCULAR

## 2013-08-02 NOTE — Progress Notes (Signed)
This 57 year old gentleman with persistent cough. He's feeling better but still has not stopped wheezing at night. His appetite is also better. He feels he needs another shot of Depo-Medrol the gym over the home.  Objective: No acute distress Chest: Clear to auscultation Heart: Regular no murmur Abdomen: Soft nontender without HSM or masses  Assessment: COPD with acute exacerbation  Plan: Recommend quitting cigarettes, Acute bronchitis - Plan: methylPREDNISolone acetate (DEPO-MEDROL) injection 80 mg, HYDROcodone-homatropine (HYCODAN) 5-1.5 MG/5ML syrup  COPD (chronic obstructive pulmonary disease)  Signed, Elvina Sidle, MD

## 2013-08-12 ENCOUNTER — Ambulatory Visit: Payer: Self-pay | Admitting: Family Medicine

## 2013-08-12 VITALS — BP 120/80 | HR 77 | Temp 98.6°F | Resp 18 | Ht 70.0 in | Wt 145.0 lb

## 2013-08-12 DIAGNOSIS — J209 Acute bronchitis, unspecified: Secondary | ICD-10-CM

## 2013-08-12 MED ORDER — HYDROCODONE-HOMATROPINE 5-1.5 MG/5ML PO SYRP: 5.0000 mL | ORAL_SOLUTION | Freq: Three times a day (TID) | ORAL | Status: DC | PRN

## 2013-08-12 MED ORDER — METHYLPREDNISOLONE ACETATE 80 MG/ML IJ SUSP
80.0000 mg | Freq: Once | INTRAMUSCULAR | Status: AC
  Administered 2013-08-12: 80 mg via INTRAMUSCULAR

## 2013-08-12 NOTE — Progress Notes (Signed)
     Patient Name: Luke Franco Date of Birth: September 13, 1955 Medical Record Number: [id number] Gender: male Date of Encounter: 08/12/2013  Chief Complaint: sinus pressure behind eyes, Cough, Diarrhea and rx refills   History of Present Illness:  Luke Franco is a 57 y.o. very pleasant male patient who presents with the following:  Pt presents to [facility] seeking follow up from his visit on 12/10 today. He says he is experiencing improvement but still notes some symptoms. He reports a mild cough, nasal congestion, and mild sinus pressure. Pt states he has gained 3 pounds since his last visit. He also says his appetite has improvement significantly. He feels he is well on his way to complete recovery. He denies any nausea, diarrhea, or vomiting.  Patient Active Problem List   Diagnosis Date Noted  . Tobacco abuse 06/30/2013   History reviewed. No pertinent past medical history. History reviewed. No pertinent past surgical history. History  Substance Use Topics  . Smoking status: Current Every Day Smoker -- 1.00 packs/day for 25 years    Types: Cigarettes  . Smokeless tobacco: Not on file  . Alcohol Use: No   Family History  Problem Relation Age of Onset  . Diabetes Mother   . Diabetes Brother    No Known Allergies  Medication list has been reviewed and updated.  Current Outpatient Prescriptions on File Prior to Visit  Medication Sig Dispense Refill  . HYDROcodone-homatropine (HYCODAN) 5-1.5 MG/5ML syrup Take 5 mLs by mouth every 8 (eight) hours as needed for cough.  120 mL  0  . amoxicillin (AMOXIL) 875 MG tablet Take 1 tablet (875 mg total) by mouth 2 (two) times daily.  20 tablet  0   No current facility-administered medications on file prior to visit.    Review of Systems:    Physical Examination: Filed Vitals:   08/12/13 1303  BP: 120/80  Pulse: 77  Temp: 98.6 F (37 C)  Resp: 18   @vitals2 @ Body mass index is 20.81 kg/(m^2). Ideal Body Weight:  @FLOWAMB ([id number])@  HEENT: Mild dullness of both eardrums with slight bulging Oropharynx: Clear with denture in the upper teeth and several areas of gingivitis on the lower side Neck: Supple no adenopathy Chest: Few after he wheezes otherwise negative  EKG / Labs / Xrays: None available at time of encounter  Assessment and Plan: Acute bronchitis - Plan: HYDROcodone-homatropine (HYCODAN) 5-1.5 MG/5ML syrup, methylPREDNISolone acetate (DEPO-MEDROL) injection 80 mg  Signed, Elvina Sidle, MD

## 2013-08-31 ENCOUNTER — Telehealth: Payer: Self-pay

## 2013-08-31 NOTE — Telephone Encounter (Signed)
Patient would like a referral to ent dr if possible

## 2013-09-01 NOTE — Telephone Encounter (Signed)
What is referral for? He states he is having trouble with his sinuses. I have provided Dr Joseph Art hours and he will come to see him. I have recommended Flonase and Mucinex until he can come in.

## 2013-09-05 ENCOUNTER — Ambulatory Visit: Payer: Self-pay | Admitting: Family Medicine

## 2013-09-05 VITALS — BP 122/78 | HR 107 | Temp 98.0°F | Resp 18 | Ht 71.0 in | Wt 157.0 lb

## 2013-09-05 DIAGNOSIS — J329 Chronic sinusitis, unspecified: Secondary | ICD-10-CM

## 2013-09-05 MED ORDER — METHYLPREDNISOLONE ACETATE 80 MG/ML IJ SUSP
80.0000 mg | Freq: Once | INTRAMUSCULAR | Status: AC
  Administered 2013-09-05: 80 mg via INTRAMUSCULAR

## 2013-09-05 MED ORDER — AZITHROMYCIN 250 MG PO TABS: ORAL_TABLET | ORAL | Status: DC

## 2013-09-05 NOTE — Progress Notes (Signed)
Subjective:  This chart was scribed for Robyn Haber, MD by Donato Schultz, Medical Scribe. This patient was seen in Room 2 and the patient's care was started at 7:08 PM.   Patient ID: Luke Franco, male    DOB: 09/07/1955, 58 y.o.   MRN: [id number]  HPI HPI Comments: Luke Franco is a 58 y.o. male who presents to the [facility] complaining of constant sinus pressure, sinus pain, and hyperactive bowel sounds that started the Friday after Christmas.  The patient states that his appetite has returned to baseline and his nausea and emesis has subsided.  He states that he cannot take Amoxicillin after he experienced negative symptoms.  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     No past medical history on file. No past surgical history on file. Family History  Problem Relation Age of Onset   Diabetes Mother    Diabetes Brother    History   Social History   Marital Status: Married    Spouse Name: N/A    Number of Children: N/A   Years of Education: N/A   Occupational History   Not on file.   Social History Main Topics   Smoking status: Current Every Day Smoker -- 1.00 packs/day for 25 years    Types: Cigarettes   Smokeless tobacco: Not on file   Alcohol Use: No   Drug Use: Not on file   Sexual Activity: Not on file   Other Topics Concern   Not on file   Social History Narrative   No narrative on file   Allergies  Allergen Reactions   Amoxicillin     vomiting    Review of Systems  HENT: Positive for sinus pressure.   All other systems reviewed and are negative.     Objective:  Physical Exam  Nursing note and vitals reviewed. Constitutional: He is oriented to person, place, and time. He appears well-developed and well-nourished.  HENT:  Head: Normocephalic and atraumatic.  Right Ear: External ear normal.  Left Ear: External ear normal.  Eyes: Conjunctivae and EOM are normal. Pupils are equal, round, and  reactive to light.  Neck: Normal range of motion and phonation normal.  Cardiovascular: Normal rate, regular rhythm, normal heart sounds and intact distal pulses.  Exam reveals no gallop and no friction rub.   No murmur heard. Pulmonary/Chest: Effort normal and breath sounds normal. No respiratory distress. He has no wheezes. He has no rales. He exhibits no bony tenderness.  Abdominal: Soft. Normal appearance. There is no tenderness.  Musculoskeletal: Normal range of motion.  Neurological: He is alert and oriented to person, place, and time. No cranial nerve deficit or sensory deficit. He exhibits normal muscle tone. Coordination normal.  Skin: Skin is warm, dry and intact.  Psychiatric: He has a normal mood and affect. His behavior is normal. Judgment and thought content normal.   Mucopurulent nasal discharge bilaterally, worse on the right    BP 122/78   Pulse 107   Temp(Src) 98 F (36.7 C) (Oral)   Resp 18   Ht 5\' 11"  (1.803 m)   Wt 157 lb (71.215 kg)   BMI 21.91 kg/m2   SpO2 97% Assessment & Plan:    I personally performed the services described in this documentation, which was scribed in my presence. The recorded information has been reviewed and is accurate.  Sinusitis - Plan: methylPREDNISolone acetate (DEPO-MEDROL) injection 80 mg, azithromycin (ZITHROMAX Z-PAK) 250 MG tablet  Signed, Robyn Haber, MD

## 2013-09-05 NOTE — Patient Instructions (Signed)

## 2014-11-18 ENCOUNTER — Ambulatory Visit (INDEPENDENT_AMBULATORY_CARE_PROVIDER_SITE_OTHER): Payer: BLUE CROSS/BLUE SHIELD | Admitting: Family Medicine

## 2014-11-18 VITALS — BP 120/60 | HR 71 | Temp 98.0°F | Resp 16 | Ht 70.0 in | Wt 157.4 lb

## 2014-11-18 DIAGNOSIS — T148 Other injury of unspecified body region: Secondary | ICD-10-CM | POA: Diagnosis not present

## 2014-11-18 DIAGNOSIS — M2550 Pain in unspecified joint: Secondary | ICD-10-CM

## 2014-11-18 DIAGNOSIS — W57XXXA Bitten or stung by nonvenomous insect and other nonvenomous arthropods, initial encounter: Secondary | ICD-10-CM

## 2014-11-18 MED ORDER — IBUPROFEN 800 MG PO TABS: 800.0000 mg | ORAL_TABLET | Freq: Three times a day (TID) | ORAL | Status: DC | PRN

## 2014-11-18 MED ORDER — DOXYCYCLINE HYCLATE 100 MG PO TABS: 100.0000 mg | ORAL_TABLET | Freq: Two times a day (BID) | ORAL | Status: DC

## 2014-11-18 NOTE — Patient Instructions (Signed)
Tick Bite Information Ticks are insects that attach themselves to the skin and draw blood for food. There are various types of ticks. Common types include wood ticks and deer ticks. Most ticks live in shrubs and grassy areas. Ticks can climb onto your body when you make contact with leaves or grass where the tick is waiting. The most common places on the body for ticks to attach themselves are the scalp, neck, armpits, waist, and groin. Most tick bites are harmless, but sometimes ticks carry germs that cause diseases. These germs can be spread to a person during the tick's feeding process. The chance of a disease spreading through a tick bite depends on:   The type of tick.  Time of year.   How long the tick is attached.   Geographic location.  HOW CAN YOU PREVENT TICK BITES? Take these steps to help prevent tick bites when you are outdoors:  Wear protective clothing. Long sleeves and long pants are best.   Wear white clothes so you can see ticks more easily.  Tuck your pant legs into your socks.   If walking on a trail, stay in the middle of the trail to avoid brushing against bushes.  Avoid walking through areas with long grass.  Put insect repellent on all exposed skin and along boot tops, pant legs, and sleeve cuffs.   Check clothing, hair, and skin repeatedly and before going inside.   Brush off any ticks that are not attached.  Take a shower or bath as soon as possible after being outdoors.  WHAT IS THE PROPER WAY TO REMOVE A TICK? Ticks should be removed as soon as possible to help prevent diseases caused by tick bites. 1. If latex gloves are available, put them on before trying to remove a tick.  2. Using fine-point tweezers, grasp the tick as close to the skin as possible. You may also use curved forceps or a tick removal tool. Grasp the tick as close to its head as possible. Avoid grasping the tick on its body. 3. Pull gently with steady upward pressure until  the tick lets go. Do not twist the tick or jerk it suddenly. This may break off the tick's head or mouth parts. 4. Do not squeeze or crush the tick's body. This could force disease-carrying fluids from the tick into your body.  5. After the tick is removed, wash the bite area and your hands with soap and water or other disinfectant such as alcohol. 6. Apply a small amount of antiseptic cream or ointment to the bite site.  7. Wash and disinfect any instruments that were used.  Do not try to remove a tick by applying a hot match, petroleum jelly, or fingernail polish to the tick. These methods do not work and may increase the chances of disease being spread from the tick bite.  WHEN SHOULD YOU SEEK MEDICAL CARE? Contact your health care provider if you are unable to remove a tick from your skin or if a part of the tick breaks off and is stuck in the skin.  After a tick bite, you need to be aware of signs and symptoms that could be related to diseases spread by ticks. Contact your health care provider if you develop any of the following in the days or weeks after the tick bite:  Unexplained fever.  Rash. A circular rash that appears days or weeks after the tick bite may indicate the possibility of Lyme disease. The rash may resemble   a target with a bull's-eye and may occur at a different part of your body than the tick bite.  Redness and swelling in the area of the tick bite.   Tender, swollen lymph glands.   Diarrhea.   Weight loss.   Cough.   Fatigue.   Muscle, joint, or bone pain.   Abdominal pain.   Headache.   Lethargy or a change in your level of consciousness.  Difficulty walking or moving your legs.   Numbness in the legs.   Paralysis.  Shortness of breath.   Confusion.   Repeated vomiting.  Document Released: 08/07/2000 Document Revised: 05/31/2013 Document Reviewed: 01/18/2013 Exit[employer] Patient Information 2015 [employer]. This information is  not intended to replace advice given to you by your health care provider. Make sure you discuss any questions you have with your health care provider.  

## 2014-11-18 NOTE — Progress Notes (Signed)
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 His wife is also patient here name Olin Hauser.  Patient was bit by a tick on his right flank and he removed the tick 2 days ago. He's had some irritation in the flank with some redness around the tick site.  He's also noticed some irritation in the left popliteal area with an erosion similar to the one on his flank.  Patient's had felt some malaise and mild nausea over the last 24 hours. He's had some chills and sweats as well.  Objective: Patient has a 4 mm ulceration the right flank with surrounding 3-4 mm of erythema. He also has a 5 mm ulceration on the left lateral popliteal area with 3-4 mm of surrounding erythema as well.  This chart was scribed in my presence and reviewed by me personally.    ICD-9-CM ICD-10-CM   1. Tick bite 919.4 T14.8 doxycycline (VIBRA-TABS) 100 MG tablet   E906.4 W57.XXXA   2. Arthralgia 719.40 M25.50 ibuprofen (ADVIL,MOTRIN) 800 MG tablet     Signed, Robyn Haber, MD

## 2015-11-26 ENCOUNTER — Ambulatory Visit (INDEPENDENT_AMBULATORY_CARE_PROVIDER_SITE_OTHER): Payer: 59 | Admitting: Physician Assistant

## 2015-11-26 VITALS — BP 138/78 | HR 74 | Temp 98.3°F | Resp 16 | Ht 71.0 in | Wt 156.0 lb

## 2015-11-26 DIAGNOSIS — R05 Cough: Secondary | ICD-10-CM

## 2015-11-26 DIAGNOSIS — R059 Cough, unspecified: Secondary | ICD-10-CM

## 2015-11-26 MED ORDER — AZITHROMYCIN 250 MG PO TABS: ORAL_TABLET | ORAL | Status: AC

## 2015-11-26 MED ORDER — BENZONATATE 100 MG PO CAPS: 100.0000 mg | ORAL_CAPSULE | Freq: Three times a day (TID) | ORAL | Status: DC | PRN

## 2015-11-26 NOTE — Progress Notes (Signed)
[facility] [address], [place] [address] [phone]  Date:  11/26/2015   Name:  Luke Franco   DOB:  Aug 28, 1955   MRN:  [id number]  PCP:  Robyn Haber, MD    Chief Complaint: Sore Throat; Cough; and Ear Pain   History of Present Illness:  This is a 60 y.o. male who is presenting with 2 days of sore throat, cough and otalgia. Cough is productive. Having a little sob and wheezing. Mild nasal congestion. States both ears feel full. Having chills but no fever.  Aggravating/alleviating factors: nyquil and no help History of asthma: no History of env allergies: no Tobacco use: smokes 1 ppd Has sick contacts at work and at home. Pt states he needs some antibiotics and a shot. States this is what Dr. Joseph Art usually does for him. He states he is trying to catch his illness early this time because usually develops into bronchitis.   Review of Systems:  Review of Systems See HPI  Patient Active Problem List   Diagnosis Date Noted  . Tobacco abuse 06/30/2013   Home meds: None   Allergies  Allergen Reactions  . Amoxicillin     vomiting    Past Surgical History  Procedure Laterality Date  . Kidney stone surgery      Social History  Substance Use Topics  . Smoking status: Current Every Day Smoker -- 1.00 packs/day for 25 years    Types: Cigarettes  . Smokeless tobacco: Never Used  . Alcohol Use: No    Family History  Problem Relation Age of Onset  . Diabetes Mother   . Heart disease Mother   . Diabetes Brother     Medication list has been reviewed and updated.  Physical Examination:  Physical Exam  Constitutional: He is oriented to person, place, and time. He appears well-developed and well-nourished. No distress.  HENT:  Head: Normocephalic and atraumatic.  Right Ear: Hearing, tympanic membrane, external ear and ear canal normal.  Left Ear: Hearing, tympanic membrane, external ear and ear canal normal.  Nose: Nose normal.   Mouth/Throat: Uvula is midline and mucous membranes are normal. Posterior oropharyngeal erythema present. No oropharyngeal exudate or posterior oropharyngeal edema.  Eyes: Conjunctivae and lids are normal. Right eye exhibits no discharge. Left eye exhibits no discharge. No scleral icterus.  Cardiovascular: Normal rate, regular rhythm, normal heart sounds and normal pulses.   No murmur heard. Pulmonary/Chest: Effort normal and breath sounds normal. No respiratory distress. He has no wheezes. He has no rhonchi. He has no rales.  Musculoskeletal: Normal range of motion.  Lymphadenopathy:       Head (right side): No submental, no submandibular and no tonsillar adenopathy present.       Head (left side): No submental, no submandibular and no tonsillar adenopathy present.    He has no cervical adenopathy.  Neurological: He is alert and oriented to person, place, and time.  Skin: Skin is warm, dry and intact. No lesion and no rash noted.  Psychiatric: He has a normal mood and affect. His speech is normal and behavior is normal. Thought content normal.   BP 138/78 mmHg  Pulse 74  Temp(Src) 98.3 F (36.8 C)  Resp 16  Ht 5\' 11"  (1.803 m)  Wt 156 lb (70.761 kg)  BMI 21.77 kg/m2  SpO2 97%  Assessment and Plan:  1. Cough I don't necessarily think pt needs abx at this point however he is a smoker and is prone to  bronchitis. Will treat with zpak and tessalon. Return in 7-10 days if symptoms do not improve or at any time if symptoms worsen.  - azithromycin (ZITHROMAX) 250 MG tablet; Take 2 tabs PO x 1 dose, then 1 tab PO QD x 4 days  Dispense: 6 tablet; Refill: 0 - benzonatate (TESSALON) 100 MG capsule; Take 1-2 capsules (100-200 mg total) by mouth 3 (three) times daily as needed for cough.  Dispense: 40 capsule; Refill: 0   Benjaman Pott. Drenda Freeze, MHS [facility]  11/26/2015

## 2015-11-26 NOTE — Patient Instructions (Addendum)
Drink plenty of water (64 oz/day) and get plenty of rest. Take zpak as directed. Take tessalon as needed for cough. If your symptoms are not improving in 1 week, return to clinic.     IF you received an x-ray today, you will receive an invoice from [facility]. Please contact [facility] at [phone] with questions or concerns regarding your invoice.   IF you received labwork today, you will receive an invoice from [employer]. Please contact [facility] at [phone] with questions or concerns regarding your invoice.   Our billing staff will not be able to assist you with questions regarding bills from these companies.  You will be contacted with the lab results as soon as they are available. The fastest way to get your results is to activate your My Chart account. Instructions are located on the last page of this paperwork. If you have not heard from [place] regarding the results in 2 weeks, please contact this office.

## 2015-12-04 ENCOUNTER — Ambulatory Visit (INDEPENDENT_AMBULATORY_CARE_PROVIDER_SITE_OTHER): Payer: 59 | Admitting: Family Medicine

## 2015-12-04 VITALS — BP 140/80 | HR 94 | Temp 97.9°F | Resp 18 | Ht 70.0 in | Wt 153.0 lb

## 2015-12-04 DIAGNOSIS — F411 Generalized anxiety disorder: Secondary | ICD-10-CM

## 2015-12-04 DIAGNOSIS — J209 Acute bronchitis, unspecified: Secondary | ICD-10-CM | POA: Diagnosis not present

## 2015-12-04 MED ORDER — LEVOFLOXACIN 500 MG PO TABS: 500.0000 mg | ORAL_TABLET | Freq: Every day | ORAL | Status: DC

## 2015-12-04 MED ORDER — ALPRAZOLAM 0.5 MG PO TBDP: 0.5000 mg | ORAL_TABLET | Freq: Two times a day (BID) | ORAL | Status: DC | PRN

## 2015-12-04 MED ORDER — PREDNISONE 20 MG PO TABS: ORAL_TABLET | ORAL | Status: DC

## 2015-12-04 MED ORDER — HYDROCODONE-HOMATROPINE 5-1.5 MG/5ML PO SYRP: 5.0000 mL | ORAL_SOLUTION | Freq: Three times a day (TID) | ORAL | Status: DC | PRN

## 2015-12-04 NOTE — Progress Notes (Signed)
Patient ID: Luke Franco MRN: [id number], DOB: [birth year], 60 y.o. Date of Encounter: 12/04/2015, 11:31 AM  Primary Physician: Robyn Haber, MD  Chief Complaint:  Chief Complaint  Patient presents with  . Follow-up    cough,congestion    HPI: 60 y.o. year old male presents with a 21 day history of nasal congestion, post nasal drip, sore throat, and cough. Mild sinus pressure. Afebrile. No chills. Nasal congestion thick and green/yellow. Cough is productive of green/yellow sputum and not associated with time of day. Ears feel full, leading to sensation of muffled hearing. Has tried OTC cold preps without success. No GI complaints.   No sick contacts, recent antibiotics, or recent travels.   No leg trauma, sedentary periods, h/o cancer.  Has reduced smoking to under a pack per day.  Past Medical History  Diagnosis Date  . Allergy      Home Meds: Prior to Admission medications   Medication Sig Start Date End Date Taking? Authorizing Provider  ALPRAZolam (NIRAVAM) 0.5 MG dissolvable tablet Take 1 tablet (0.5 mg total) by mouth 2 (two) times daily as needed for anxiety. 12/04/15   Robyn Haber, MD  levofloxacin (LEVAQUIN) 500 MG tablet Take 1 tablet (500 mg total) by mouth daily. 12/04/15   Robyn Haber, MD  predniSONE (DELTASONE) 20 MG tablet Two daily with food 12/04/15   Robyn Haber, MD    Allergies:  Allergies  Allergen Reactions  . Amoxicillin     vomiting    Social History   Social History  . Marital Status: Married    Spouse Name: N/A  . Number of Children: N/A  . Years of Education: N/A   Occupational History  . Not on file.   Social History Main Topics  . Smoking status: Current Every Day Smoker -- 1.00 packs/day for 25 years    Types: Cigarettes  . Smokeless tobacco: Never Used  . Alcohol Use: No  . Drug Use: No  . Sexual Activity: Not on file   Other Topics Concern  . Not on file   Social History Narrative     Review of  Systems: Constitutional: negative for chills, fever, night sweats or weight changes Cardiovascular: negative for chest pain or palpitations Respiratory: negative for hemoptysis, wheezing, or shortness of breath Abdominal: negative for abdominal pain, nausea, vomiting or diarrhea Dermatological: negative for rash Neurologic: negative for headache   Physical Exam: Blood pressure 140/80, pulse 94, temperature 97.9 F (36.6 C), temperature source Oral, resp. rate 18, height 5\' 10"  (1.778 m), weight 153 lb (69.4 kg), SpO2 98 %., Body mass index is 21.95 kg/(m^2). General: Well developed, well nourished, in no acute distress. Head: Normocephalic, atraumatic, eyes without discharge, sclera non-icteric, nares are congested. Bilateral auditory canals clear, TM's are without perforation, pearly grey with reflective cone of light bilaterally. No sinus TTP. Oral cavity moist, dentition normal. Posterior pharynx with post nasal drip and mild erythema. No peritonsillar abscess or tonsillar exudate. Neck: Supple. No thyromegaly. Full ROM. No lymphadenopathy. Lungs: Coarse breath sounds bilaterally without wheezes, rales, or rhonchi. Breathing is unlabored.  Heart: RRR with S1 S2. No murmurs, rubs, or gallops appreciated. Msk:  Strength and tone normal for age. Extremities: No clubbing or cyanosis. No edema. Neuro: Alert and oriented X 3. Moves all extremities spontaneously. CNII-XII grossly in tact. Psych:  Responds to questions appropriately with a normal affect.   Labs:   ASSESSMENT AND PLAN:  60 y.o. year old male with bronchitis. -   ICD-9-CM ICD-10-CM  1. Acute bronchitis, unspecified organism 466.0 J20.9 predniSONE (DELTASONE) 20 MG tablet     levofloxacin (LEVAQUIN) 500 MG tablet     HYDROcodone-homatropine (HYCODAN) 5-1.5 MG/5ML syrup  2. GAD (generalized anxiety disorder) 300.02 F41.1 ALPRAZolam (NIRAVAM) 0.5 MG dissolvable tablet   -Tylenol/Motrin prn -Rest/fluids -RTC  precautions -RTC 3-5 days if no improvement  Signed, Robyn Haber, MD 12/04/2015 11:31 AM

## 2015-12-04 NOTE — Patient Instructions (Addendum)

## 2016-09-22 ENCOUNTER — Ambulatory Visit (INDEPENDENT_AMBULATORY_CARE_PROVIDER_SITE_OTHER): Payer: 59 | Admitting: Family Medicine

## 2016-09-22 VITALS — BP 126/86 | HR 86 | Temp 98.3°F | Ht 70.0 in | Wt 159.6 lb

## 2016-09-22 DIAGNOSIS — R05 Cough: Secondary | ICD-10-CM | POA: Diagnosis not present

## 2016-09-22 DIAGNOSIS — Z87898 Personal history of other specified conditions: Secondary | ICD-10-CM

## 2016-09-22 DIAGNOSIS — R69 Illness, unspecified: Secondary | ICD-10-CM | POA: Diagnosis not present

## 2016-09-22 DIAGNOSIS — J111 Influenza due to unidentified influenza virus with other respiratory manifestations: Secondary | ICD-10-CM

## 2016-09-22 DIAGNOSIS — R059 Cough, unspecified: Secondary | ICD-10-CM

## 2016-09-22 MED ORDER — ALBUTEROL SULFATE HFA 108 (90 BASE) MCG/ACT IN AERS: 1.0000 | INHALATION_SPRAY | RESPIRATORY_TRACT | 0 refills | Status: DC | PRN

## 2016-09-22 MED ORDER — OSELTAMIVIR PHOSPHATE 75 MG PO CAPS: 75.0000 mg | ORAL_CAPSULE | Freq: Two times a day (BID) | ORAL | 0 refills | Status: DC

## 2016-09-22 NOTE — Patient Instructions (Addendum)
Your symptoms appear to be due to influenza. Can start Tamiflu as discussed. Saline nasal spray at least 4 times per day if needed for nasal congestion, over the counter mucinex or mucinex DM as needed for cough, tylenol or ibuprofen over the counter for fever and body aches, and drink plenty of fluids. If you do start wheezing, I did send in the albuterol inhaler to be used up to every 4 hours. If you require that medicine more than 2-3 times per day, or persistently needing that medicine in 4-5 days, return for recheck. Other information as in instructions below.  Return to the clinic or go to the nearest emergency room if any of your symptoms worsen or new symptoms occur.   Bronchospasm, Adult A bronchospasm is a spasm or tightening of the airways going into the lungs. During a bronchospasm breathing becomes more difficult because the airways get smaller. When this happens there can be coughing, a whistling sound when breathing (wheezing), and difficulty breathing. Bronchospasm is often associated with asthma, but not all patients who experience a bronchospasm have asthma. What are the causes? A bronchospasm is caused by inflammation or irritation of the airways. The inflammation or irritation may be triggered by:  Allergies (such as to animals, pollen, food, or mold). Allergens that cause bronchospasm may cause wheezing immediately after exposure or many hours later.  Infection. Viral infections are believed to be the most common cause of bronchospasm.  Exercise.  Irritants (such as pollution, cigarette smoke, strong odors, aerosol sprays, and paint fumes).  Weather changes. Winds increase molds and pollens in the air. Rain refreshes the air by washing irritants out. Cold air may cause inflammation.  Stress and emotional upset. What are the signs or symptoms?  Wheezing.  Excessive nighttime coughing.  Frequent or severe coughing with a simple cold.  Chest tightness.  Shortness of  breath. How is this diagnosed? Bronchospasm is usually diagnosed through a history and physical exam. Tests, such as chest X-rays, are sometimes done to look for other conditions. How is this treated?  Inhaled medicines can be given to open up your airways and help you breathe. The medicines can be given using either an inhaler or a nebulizer machine.  Corticosteroid medicines may be given for severe bronchospasm, usually when it is associated with asthma. Follow these instructions at home:  Always have a plan prepared for seeking medical care. Know when to call your health care provider and local emergency services (911 in the [place] Know where you can access local emergency care.  Only take medicines as directed by your health care provider.  If you were prescribed an inhaler or nebulizer machine, ask your health care provider to explain how to use it correctly. Always use a spacer with your inhaler if you were given one.  It is necessary to remain calm during an attack. Try to relax and breathe more slowly.  Control your home environment in the following ways:  Change your heating and air conditioning filter at least once a month.  Limit your use of fireplaces and wood stoves.  Do not smoke and do not allow smoking in your home.  Avoid exposure to perfumes and fragrances.  Get rid of pests (such as roaches and mice) and their droppings.  Throw away plants if you see mold on them.  Keep your house clean and dust free.  Replace carpet with wood, tile, or vinyl flooring. Carpet can trap dander and dust.  Use allergy-proof pillows, mattress covers, and  box spring covers.  Wash bed sheets and blankets every week in hot water and dry them in a dryer.  Use blankets that are made of polyester or cotton.  Wash hands frequently. Contact a health care provider if:  You have muscle aches.  You have chest pain.  The sputum changes from clear or white to yellow, green, gray, or  bloody.  The sputum you cough up gets thicker.  There are problems that may be related to the medicine you are given, such as a rash, itching, swelling, or trouble breathing. Get help right away if:  You have worsening wheezing and coughing even after taking your prescribed medicines.  You have increased difficulty breathing.  You develop severe chest pain. This information is not intended to replace advice given to you by your health care provider. Make sure you discuss any questions you have with your health care provider. Document Released: 08/13/2003 Document Revised: 01/16/2016 Document Reviewed: 01/30/2013 Elsevier Interactive Patient Education  2017 [employer].   Influenza, Adult Influenza, more commonly known as "the flu," is a viral infection that primarily affects the respiratory tract. The respiratory tract includes organs that help you breathe, such as the lungs, nose, and throat. The flu causes many common cold symptoms, as well as a high fever and body aches. The flu spreads easily from person to person (is contagious). Getting a flu shot (influenza vaccination) every year is the best way to prevent influenza. What are the causes? Influenza is caused by a virus. You can catch the virus by:  Breathing in droplets from an infected person's cough or sneeze.  Touching something that was recently contaminated with the virus and then touching your mouth, nose, or eyes. What increases the risk? The following factors may make you more likely to get the flu:  Not cleaning your hands frequently with soap and water or alcohol-based hand sanitizer.  Having close contact with many people during cold and flu season.  Touching your mouth, eyes, or nose without washing or sanitizing your hands first.  Not drinking enough fluids or not eating a healthy diet.  Not getting enough sleep or exercise.  Being under a high amount of stress.  Not getting a yearly (annual) flu  shot. You may be at a higher risk of complications from the flu, such as a severe lung infection (pneumonia), if you:  Are over the age of 52.  Are pregnant.  Have a weakened disease-fighting system (immune system). You may have a weakened immune system if you:  Have HIV or AIDS.  Are undergoing chemotherapy.  Aretaking medicines that reduce the activity of (suppress) the immune system.  Have a long-term (chronic) illness, such as heart disease, kidney disease, diabetes, or lung disease.  Have a liver disorder.  Are obese.  Have anemia. What are the signs or symptoms? Symptoms of this condition typically last 4-10 days and may include:  Fever.  Chills.  Headache, body aches, or muscle aches.  Sore throat.  Cough.  Runny or congested nose.  Chest discomfort and cough.  Poor appetite.  Weakness or tiredness (fatigue).  Dizziness.  Nausea or vomiting. How is this diagnosed? This condition may be diagnosed based on your medical history and a physical exam. Your health care provider may do a nose or throat swab test to confirm the diagnosis. How is this treated? If influenza is detected early, you can be treated with antiviral medicine that can reduce the length of your illness and the severity  of your symptoms. This medicine may be given by mouth (orally) or through an IV tube that is inserted in one of your veins. The goal of treatment is to relieve symptoms by taking care of yourself at home. This may include taking over-the-counter medicines, drinking plenty of fluids, and adding humidity to the air in your home. In some cases, influenza goes away on its own. Severe influenza or complications from influenza may be treated in a hospital. Follow these instructions at home:  Take over-the-counter and prescription medicines only as told by your health care provider.  Use a cool mist humidifier to add humidity to the air in your home. This can make breathing  easier.  Rest as needed.  Drink enough fluid to keep your urine clear or pale yellow.  Cover your mouth and nose when you cough or sneeze.  Wash your hands with soap and water often, especially after you cough or sneeze. If soap and water are not available, use hand sanitizer.  Stay home from work or school as told by your health care provider. Unless you are visiting your health care provider, try to avoid leaving home until your fever has been gone for 24 hours without the use of medicine.  Keep all follow-up visits as told by your health care provider. This is important. How is this prevented?  Getting an annual flu shot is the best way to avoid getting the flu. You may get the flu shot in late summer, fall, or winter. Ask your health care provider when you should get your flu shot.  Wash your hands often or use hand sanitizer often.  Avoid contact with people who are sick during cold and flu season.  Eat a healthy diet, drink plenty of fluids, get enough sleep, and exercise regularly. Contact a health care provider if:  You develop new symptoms.  You have:  Chest pain.  Diarrhea.  A fever.  Your cough gets worse.  You produce more mucus.  You feel nauseous or you vomit. Get help right away if:  You develop shortness of breath or difficulty breathing.  Your skin or nails turn a bluish color.  You have severe pain or stiffness in your neck.  You develop a sudden headache or sudden pain in your face or ear.  You cannot stop vomiting. This information is not intended to replace advice given to you by your health care provider. Make sure you discuss any questions you have with your health care provider. Document Released: 08/07/2000 Document Revised: 01/16/2016 Document Reviewed: 06/04/2015 Elsevier Interactive Patient Education  2017 [employer].    IF you received an x-ray today, you will receive an invoice from [facility]. Please contact  [facility] at [phone] with questions or concerns regarding your invoice.   IF you received labwork today, you will receive an invoice from [place]. Please contact [facility] at [phone] with questions or concerns regarding your invoice.   Our billing staff will not be able to assist you with questions regarding bills from these companies.  You will be contacted with the lab results as soon as they are available. The fastest way to get your results is to activate your My Chart account. Instructions are located on the last page of this paperwork. If you have not heard from [place] regarding the results in 2 weeks, please contact this office.

## 2016-09-22 NOTE — Progress Notes (Signed)
By signing my name below, I, Mesha Guinyard, attest that this documentation has been prepared under the direction and in the presence of [name], MD.  Electronically Signed: Verlee Monte, Medical Scribe. 09/22/16. 9:40 AM.  Subjective:    Patient ID: Luke Franco, male    DOB: 08-Oct-1955, 61 y.o.   MRN: [id number]  HPI Chief Complaint  Patient presents with  . Cough    X 1 day with head congestion    HPI Comments: Luke Franco is a 61 y.o. male who presents to the [facility] complaining of cough and nasal and sinus congestion last night. Reviewed previous records - treated for bronchitis in 2017. Pt states he woke up in sweats and has associated sxs of ear itchiness, hot/cold spells, myalgias, little HA, "tight stomach", nausea, disorientation, and it feels hot behind his eyes. Pt admits to smoking and has had to use an inhaler in the past when he was sick. Pt has not had his flu shot this year.  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  Patient Active Problem List   Diagnosis Date Noted  . Tobacco abuse 06/30/2013   Past Medical History:  Diagnosis Date  . Allergy    Past Surgical History:  Procedure Laterality Date  . KIDNEY STONE SURGERY     Allergies  Allergen Reactions  . Amoxicillin     vomiting   Prior to Admission medications   Medication Sig Start Date End Date Taking? Authorizing Provider  ALPRAZolam (NIRAVAM) 0.5 MG dissolvable tablet Take 1 tablet (0.5 mg total) by mouth 2 (two) times daily as needed for anxiety. 12/04/15  Yes Robyn Haber, MD  HYDROcodone-homatropine [facility]) 5-1.5 MG/5ML syrup Take 5 mLs by mouth every 8 (eight) hours as needed for cough. 12/04/15  Yes Robyn Haber, MD  levofloxacin (LEVAQUIN) 500 MG tablet Take 1 tablet (500 mg total) by mouth daily. 12/04/15  Yes Robyn Haber, MD  predniSONE (DELTASONE) 20 MG tablet Two daily with food 12/04/15  Yes Robyn Haber, MD   Social History   Social History  .  Marital status: Married    Spouse name: N/A  . Number of children: N/A  . Years of education: N/A   Occupational History  . Not on file.   Social History Main Topics  . Smoking status: Current Every Day Smoker    Packs/day: 1.00    Years: 25.00    Types: Cigarettes  . Smokeless tobacco: Never Used  . Alcohol use No  . Drug use: No  . Sexual activity: Not on file   Other Topics Concern  . Not on file   Social History Narrative  . No narrative on file   Review of Systems  Constitutional: Positive for chills, diaphoresis and fever.  HENT: Positive for congestion.   Eyes: Positive for pain (some of HA behind eyes).  Respiratory: Positive for cough.   Gastrointestinal: Positive for abdominal pain and nausea.  Musculoskeletal: Positive for myalgias.  Neurological: Positive for headaches.   Objective:  Physical Exam  Constitutional: He is oriented to person, place, and time. He appears well-developed and well-nourished.  HENT:  Head: Normocephalic and atraumatic.  Right Ear: Tympanic membrane, external ear and ear canal normal.  Left Ear: Tympanic membrane, external ear and ear canal normal.  Nose: No rhinorrhea.  Mouth/Throat: Mucous membranes are normal. Posterior oropharyngeal erythema (minimal) present. No oropharyngeal exudate.  Eyes: Conjunctivae are normal. Pupils are equal, round, and reactive to light.  Neck: Neck supple.  Cardiovascular: Normal rate, regular rhythm, normal heart sounds and intact distal pulses.  Exam reveals no friction rub.   No murmur heard. Pulmonary/Chest: Effort normal and breath sounds normal. No respiratory distress. He has no wheezes. He has no rhonchi. He has no rales.  Abdominal: Soft. There is tenderness (minimal) in the epigastric area. There is no rebound and no guarding.  Lymphadenopathy:    He has no cervical adenopathy.    He has no axillary adenopathy.  No lymph adenopathy  Neurological: He is alert and oriented to person,  place, and time.  Skin: Skin is warm and dry. No rash noted.  Psychiatric: He has a normal mood and affect. His behavior is normal.  Vitals reviewed.  BP 126/86 (BP Location: Right Arm, Patient Position: Sitting, Cuff Size: Small)   Pulse 86   Temp 98.3 F (36.8 C) (Oral)   Ht 5\' 10"  (1.778 m)   Wt 159 lb 9.6 oz (72.4 kg)   SpO2 95%   BMI 22.90 kg/m  Assessment & Plan:    Luke Franco is a 61 y.o. male Influenza-like illness - Plan: oseltamivir (TAMIFLU) 75 MG capsule  Cough  History of wheezing - Plan: albuterol (PROVENTIL HFA;VENTOLIN HFA) 108 (90 Base) MCG/ACT inhaler  Typical symptoms of influenza, except no objective fever in office. Will treat like influenza based on sx's. Tamiflu discussed, he would like to start that. Potential side effects discussed. Other symptomatic care reviewed, albuterol provided if any wheezing, RTC precautions for frequent use or worsening symptoms. Out of work until fever free for 24 hours.   Meds ordered this encounter  Medications  . oseltamivir (TAMIFLU) 75 MG capsule    Sig: Take 1 capsule (75 mg total) by mouth 2 (two) times daily.    Dispense:  10 capsule    Refill:  0  . albuterol (PROVENTIL HFA;VENTOLIN HFA) 108 (90 Base) MCG/ACT inhaler    Sig: Inhale 1-2 puffs into the lungs every 4 (four) hours as needed for wheezing or shortness of breath.    Dispense:  1 Inhaler    Refill:  0   Patient Instructions    Your symptoms appear to be due to influenza. Can start Tamiflu as discussed. Saline nasal spray at least 4 times per day if needed for nasal congestion, over the counter mucinex or mucinex DM as needed for cough, tylenol or ibuprofen over the counter for fever and body aches, and drink plenty of fluids. If you do start wheezing, I did send in the albuterol inhaler to be used up to every 4 hours. If you require that medicine more than 2-3 times per day, or persistently needing that medicine in 4-5 days, return for recheck. Other  information as in instructions below.  Return to the clinic or go to the nearest emergency room if any of your symptoms worsen or new symptoms occur.   Bronchospasm, Adult A bronchospasm is a spasm or tightening of the airways going into the lungs. During a bronchospasm breathing becomes more difficult because the airways get smaller. When this happens there can be coughing, a whistling sound when breathing (wheezing), and difficulty breathing. Bronchospasm is often associated with asthma, but not all patients who experience a bronchospasm have asthma. What are the causes? A bronchospasm is caused by inflammation or irritation of the airways. The inflammation or irritation may be triggered by:  Allergies (such as to animals, pollen, food, or mold). Allergens that cause bronchospasm may cause wheezing immediately after exposure or many  hours later.  Infection. Viral infections are believed to be the most common cause of bronchospasm.  Exercise.  Irritants (such as pollution, cigarette smoke, strong odors, aerosol sprays, and paint fumes).  Weather changes. Winds increase molds and pollens in the air. Rain refreshes the air by washing irritants out. Cold air may cause inflammation.  Stress and emotional upset. What are the signs or symptoms?  Wheezing.  Excessive nighttime coughing.  Frequent or severe coughing with a simple cold.  Chest tightness.  Shortness of breath. How is this diagnosed? Bronchospasm is usually diagnosed through a history and physical exam. Tests, such as chest X-rays, are sometimes done to look for other conditions. How is this treated?  Inhaled medicines can be given to open up your airways and help you breathe. The medicines can be given using either an inhaler or a nebulizer machine.  Corticosteroid medicines may be given for severe bronchospasm, usually when it is associated with asthma. Follow these instructions at home:  Always have a plan prepared  for seeking medical care. Know when to call your health care provider and local emergency services (911 in the [place] Know where you can access local emergency care.  Only take medicines as directed by your health care provider.  If you were prescribed an inhaler or nebulizer machine, ask your health care provider to explain how to use it correctly. Always use a spacer with your inhaler if you were given one.  It is necessary to remain calm during an attack. Try to relax and breathe more slowly.  Control your home environment in the following ways:  Change your heating and air conditioning filter at least once a month.  Limit your use of fireplaces and wood stoves.  Do not smoke and do not allow smoking in your home.  Avoid exposure to perfumes and fragrances.  Get rid of pests (such as roaches and mice) and their droppings.  Throw away plants if you see mold on them.  Keep your house clean and dust free.  Replace carpet with wood, tile, or vinyl flooring. Carpet can trap dander and dust.  Use allergy-proof pillows, mattress covers, and box spring covers.  Wash bed sheets and blankets every week in hot water and dry them in a dryer.  Use blankets that are made of polyester or cotton.  Wash hands frequently. Contact a health care provider if:  You have muscle aches.  You have chest pain.  The sputum changes from clear or white to yellow, green, gray, or bloody.  The sputum you cough up gets thicker.  There are problems that may be related to the medicine you are given, such as a rash, itching, swelling, or trouble breathing. Get help right away if:  You have worsening wheezing and coughing even after taking your prescribed medicines.  You have increased difficulty breathing.  You develop severe chest pain. This information is not intended to replace advice given to you by your health care provider. Make sure you discuss any questions you have with your health care  provider. Document Released: 08/13/2003 Document Revised: 01/16/2016 Document Reviewed: 01/30/2013 Elsevier Interactive Patient Education  2017 [place].   Influenza, Adult Influenza, more commonly known as "the flu," is a viral infection that primarily affects the respiratory tract. The respiratory tract includes organs that help you breathe, such as the lungs, nose, and throat. The flu causes many common cold symptoms, as well as a high fever and body aches. The flu spreads easily from person  to person (is contagious). Getting a flu shot (influenza vaccination) every year is the best way to prevent influenza. What are the causes? Influenza is caused by a virus. You can catch the virus by:  Breathing in droplets from an infected person's cough or sneeze.  Touching something that was recently contaminated with the virus and then touching your mouth, nose, or eyes. What increases the risk? The following factors may make you more likely to get the flu:  Not cleaning your hands frequently with soap and water or alcohol-based hand sanitizer.  Having close contact with many people during cold and flu season.  Touching your mouth, eyes, or nose without washing or sanitizing your hands first.  Not drinking enough fluids or not eating a healthy diet.  Not getting enough sleep or exercise.  Being under a high amount of stress.  Not getting a yearly (annual) flu shot. You may be at a higher risk of complications from the flu, such as a severe lung infection (pneumonia), if you:  Are over the age of 73.  Are pregnant.  Have a weakened disease-fighting system (immune system). You may have a weakened immune system if you:  Have HIV or AIDS.  Are undergoing chemotherapy.  Aretaking medicines that reduce the activity of (suppress) the immune system.  Have a long-term (chronic) illness, such as heart disease, kidney disease, diabetes, or lung disease.  Have a liver  disorder.  Are obese.  Have anemia. What are the signs or symptoms? Symptoms of this condition typically last 4-10 days and may include:  Fever.  Chills.  Headache, body aches, or muscle aches.  Sore throat.  Cough.  Runny or congested nose.  Chest discomfort and cough.  Poor appetite.  Weakness or tiredness (fatigue).  Dizziness.  Nausea or vomiting. How is this diagnosed? This condition may be diagnosed based on your medical history and a physical exam. Your health care provider may do a nose or throat swab test to confirm the diagnosis. How is this treated? If influenza is detected early, you can be treated with antiviral medicine that can reduce the length of your illness and the severity of your symptoms. This medicine may be given by mouth (orally) or through an IV tube that is inserted in one of your veins. The goal of treatment is to relieve symptoms by taking care of yourself at home. This may include taking over-the-counter medicines, drinking plenty of fluids, and adding humidity to the air in your home. In some cases, influenza goes away on its own. Severe influenza or complications from influenza may be treated in a hospital. Follow these instructions at home:  Take over-the-counter and prescription medicines only as told by your health care provider.  Use a cool mist humidifier to add humidity to the air in your home. This can make breathing easier.  Rest as needed.  Drink enough fluid to keep your urine clear or pale yellow.  Cover your mouth and nose when you cough or sneeze.  Wash your hands with soap and water often, especially after you cough or sneeze. If soap and water are not available, use hand sanitizer.  Stay home from work or school as told by your health care provider. Unless you are visiting your health care provider, try to avoid leaving home until your fever has been gone for 24 hours without the use of medicine.  Keep all follow-up  visits as told by your health care provider. This is important. How is this prevented?  Getting an annual flu shot is the best way to avoid getting the flu. You may get the flu shot in late summer, fall, or winter. Ask your health care provider when you should get your flu shot.  Wash your hands often or use hand sanitizer often.  Avoid contact with people who are sick during cold and flu season.  Eat a healthy diet, drink plenty of fluids, get enough sleep, and exercise regularly. Contact a health care provider if:  You develop new symptoms.  You have:  Chest pain.  Diarrhea.  A fever.  Your cough gets worse.  You produce more mucus.  You feel nauseous or you vomit. Get help right away if:  You develop shortness of breath or difficulty breathing.  Your skin or nails turn a bluish color.  You have severe pain or stiffness in your neck.  You develop a sudden headache or sudden pain in your face or ear.  You cannot stop vomiting. This information is not intended to replace advice given to you by your health care provider. Make sure you discuss any questions you have with your health care provider. Document Released: 08/07/2000 Document Revised: 01/16/2016 Document Reviewed: 06/04/2015 Elsevier Interactive Patient Education  2017 [employer].    IF you received an x-ray today, you will receive an invoice from [facility]. Please contact [facility] at [phone] with questions or concerns regarding your invoice.   IF you received labwork today, you will receive an invoice from [place]. Please contact [facility] at [phone] with questions or concerns regarding your invoice.   Our billing staff will not be able to assist you with questions regarding bills from these companies.  You will be contacted with the lab results as soon as they are available. The fastest way to get your results is to activate your My Chart account. Instructions are located  on the last page of this paperwork. If you have not heard from [place] regarding the results in 2 weeks, please contact this office.      I personally performed the services described in this documentation, which was scribed in my presence. The recorded information has been reviewed and considered for accuracy and completeness, addended by me as needed, and agree with information above.  Signed,   [name], MD [facility].  09/22/16 10:00 AM

## 2016-09-26 ENCOUNTER — Ambulatory Visit (INDEPENDENT_AMBULATORY_CARE_PROVIDER_SITE_OTHER): Payer: 59 | Admitting: Family Medicine

## 2016-09-26 VITALS — BP 148/76 | HR 82 | Temp 98.3°F | Resp 18 | Ht 70.0 in | Wt 154.0 lb

## 2016-09-26 DIAGNOSIS — R05 Cough: Secondary | ICD-10-CM | POA: Diagnosis not present

## 2016-09-26 DIAGNOSIS — R059 Cough, unspecified: Secondary | ICD-10-CM

## 2016-09-26 DIAGNOSIS — R0981 Nasal congestion: Secondary | ICD-10-CM

## 2016-09-26 DIAGNOSIS — J111 Influenza due to unidentified influenza virus with other respiratory manifestations: Secondary | ICD-10-CM

## 2016-09-26 DIAGNOSIS — R69 Illness, unspecified: Secondary | ICD-10-CM

## 2016-09-26 MED ORDER — BENZONATATE 100 MG PO CAPS: 100.0000 mg | ORAL_CAPSULE | Freq: Three times a day (TID) | ORAL | 0 refills | Status: DC | PRN

## 2016-09-26 MED ORDER — HYDROCODONE-HOMATROPINE 5-1.5 MG/5ML PO SYRP: ORAL_SOLUTION | ORAL | 0 refills | Status: DC

## 2016-09-26 NOTE — Patient Instructions (Addendum)
At this point your symptoms still appear to be due to a virus, and possibly the influenza virus. Finish Tamiflu today, make sure you drink plenty of fluids, try saline/saltwater nasal spray 3-4 times per day for congestion. Try the technique we discussed to lessen possibility of medicine going down your throat causing you to feel sick.   For cough: Mucinex or Tessalon Perles during the day, hydrocodone cough syrup at night if needed. Do not use that  cough syrup if you are short of breath or wheezing. Instead use albuterol for shortness of breath or wheezing and if albuterol does not help, be seen here or the emergency room.  If discolored nasal congestion is not improving within this next week, or any worsening symptoms including fever or more shortness of breath, return for recheck.    Cough, Adult Coughing is a reflex that clears your throat and your airways. Coughing helps to heal and protect your lungs. It is normal to cough occasionally, but a cough that happens with other symptoms or lasts a long time may be a sign of a condition that needs treatment. A cough may last only 2-3 weeks (acute), or it may last longer than 8 weeks (chronic). What are the causes? Coughing is commonly caused by:  Breathing in substances that irritate your lungs.  A viral or bacterial respiratory infection.  Allergies.  Asthma.  Postnasal drip.  Smoking.  Acid backing up from the stomach into the esophagus (gastroesophageal reflux).  Certain medicines.  Chronic lung problems, including COPD (or rarely, lung cancer).  Other medical conditions such as heart failure. Follow these instructions at home: Pay attention to any changes in your symptoms. Take these actions to help with your discomfort:  Take medicines only as told by your health care provider.  If you were prescribed an antibiotic medicine, take it as told by your health care provider. Do not stop taking the antibiotic even if you start to  feel better.  Talk with your health care provider before you take a cough suppressant medicine.  Drink enough fluid to keep your urine clear or pale yellow.  If the air is dry, use a cold steam vaporizer or humidifier in your bedroom or your home to help loosen secretions.  Avoid anything that causes you to cough at work or at home.  If your cough is worse at night, try sleeping in a semi-upright position.  Avoid cigarette smoke. If you smoke, quit smoking. If you need help quitting, ask your health care provider.  Avoid caffeine.  Avoid alcohol.  Rest as needed. Contact a health care provider if:  You have new symptoms.  You cough up pus.  Your cough does not get better after 2-3 weeks, or your cough gets worse.  You cannot control your cough with suppressant medicines and you are losing sleep.  You develop pain that is getting worse or pain that is not controlled with pain medicines.  You have a fever.  You have unexplained weight loss.  You have night sweats. Get help right away if:  You cough up blood.  You have difficulty breathing.  Your heartbeat is very fast. This information is not intended to replace advice given to you by your health care provider. Make sure you discuss any questions you have with your health care provider. Document Released: 02/06/2011 Document Revised: 01/16/2016 Document Reviewed: 10/17/2014 Elsevier Interactive Patient Education  2017 [employer].     IF you received an x-ray today, you will receive  an Pharmacologist from [facility]. Please contact [facility] at [phone] with questions or concerns regarding your invoice.   IF you received labwork today, you will receive an invoice from [place]. Please contact [facility] at [phone] with questions or concerns regarding your invoice.   Our billing staff will not be able to assist you with questions regarding bills from these companies.  You will be contacted  with the lab results as soon as they are available. The fastest way to get your results is to activate your My Chart account. Instructions are located on the last page of this paperwork. If you have not heard from [place] regarding the results in 2 weeks, please contact this office.

## 2016-09-26 NOTE — Progress Notes (Signed)
Subjective:  By signing my name below, I, Luke Franco, attest that this documentation has been prepared under the direction and in the presence of Luke Ray, MD.  Electronically Signed: Thea Franco, ED Scribe. 09/26/2016. 11:33 AM.   Patient ID: Luke Franco, male    DOB: 04/30/1956, 61 y.o.   MRN: [id number]  HPI Chief Complaint  Patient presents with  . Follow-up    cough/ productive    HPI Comments: Luke Franco is a 61 y.o. male who presents to the Primary Care at [facility] for follow up of productive cough. He was last seen 4 days ago with influenza like symptoms with cough and congestion at that time. He was treated with tamiflu with possible influenza. Did prescribed albuterol inhaler if needed for wheeze and discussed mucinex OTC for cough.   Pt reports persistent productive cough consisting of yellow sputum. He also reports decreased appetite, sinus congestion and pressure.  He has one pill of tamiflu left for today. He took mucinex 3-4 times and albuterol inhaler about 5 time to help with cough. Pt denies fever, SOB and  wheeze.   Patient Active Problem List   Diagnosis Date Noted  . Tobacco abuse 06/30/2013   Past Medical History:  Diagnosis Date  . Allergy    Past Surgical History:  Procedure Laterality Date  . KIDNEY STONE SURGERY     Allergies  Allergen Reactions  . Amoxicillin     vomiting   Prior to Admission medications   Medication Sig Start Date End Date Taking? Authorizing Provider  albuterol (PROVENTIL HFA;VENTOLIN HFA) 108 (90 Base) MCG/ACT inhaler Inhale 1-2 puffs into the lungs every 4 (four) hours as needed for wheezing or shortness of breath. 09/22/16  Yes Wendie Agreste, MD  ALPRAZolam (NIRAVAM) 0.5 MG dissolvable tablet Take 1 tablet (0.5 mg total) by mouth 2 (two) times daily as needed for anxiety. 12/04/15  Yes Robyn Haber, MD  HYDROcodone-homatropine [facility]) 5-1.5 MG/5ML syrup Take 5 mLs by mouth every 8 (eight) hours as needed for  cough. 12/04/15  Yes Robyn Haber, MD  oseltamivir (TAMIFLU) 75 MG capsule Take 1 capsule (75 mg total) by mouth 2 (two) times daily. 09/22/16  Yes Wendie Agreste, MD   Social History   Social History  . Marital status: Married    Spouse name: N/A  . Number of children: N/A  . Years of education: N/A   Occupational History  . Not on file.   Social History Main Topics  . Smoking status: Current Every Day Smoker    Packs/day: 1.00    Years: 25.00    Types: Cigarettes  . Smokeless tobacco: Never Used  . Alcohol use No  . Drug use: No  . Sexual activity: Not on file   Other Topics Concern  . Not on file   Social History Narrative  . No narrative on file   Review of Systems  Constitutional: Positive for appetite change. Negative for chills and fever.  HENT: Positive for congestion, postnasal drip and sinus pressure.   Respiratory: Positive for cough. Negative for shortness of breath and wheezing.     Objective:   Physical Exam  Constitutional: He is oriented to person, place, and time. He appears well-developed and well-nourished. No distress.  HENT:  Head: Normocephalic and atraumatic.  Right Ear: Tympanic membrane, external ear and ear canal normal.  Left Ear: Tympanic membrane, external ear and ear canal normal.  Nose: Nose normal. No rhinorrhea.  Mouth/Throat: Oropharynx is  clear and moist and mucous membranes are normal. No oropharyngeal exudate or posterior oropharyngeal erythema.  Eyes: Conjunctivae and EOM are normal. Pupils are equal, round, and reactive to light.  Neck: Neck supple.  Cardiovascular: Normal rate, regular rhythm, normal heart sounds and intact distal pulses.  Exam reveals no gallop and no friction rub.   No murmur heard. Pulmonary/Chest: Effort normal and breath sounds normal. He has no wheezes. He has no rhonchi. He has no rales.  Abdominal: Soft. There is no tenderness.  Musculoskeletal: Normal range of motion.  Lymphadenopathy:    He has  no cervical adenopathy.  Neurological: He is alert and oriented to person, place, and time.  Skin: Skin is warm and dry. No rash noted.  Psychiatric: He has a normal mood and affect. His behavior is normal.  Nursing note and vitals reviewed.  Vitals:   09/26/16 1053  BP: (!) 148/76  Pulse: 82  Resp: 18  Temp: 98.3 F (36.8 C)  Weight: 154 lb (69.9 kg)  Height: 5\' 10"  (1.778 m)    Assessment & Plan:    [name] is a 61 y.o. male Cough - Plan: HYDROcodone-homatropine (HYCODAN) 5-1.5 MG/5ML syrup, benzonatate (TESSALON) 100 MG capsule Influenza-like illness - Plan: HYDROcodone-homatropine (HYCODAN) 5-1.5 MG/5ML syrup Nasal congestion  - Still appears to be likely viral illness possible influenza. Has received treatment with Tamiflu. Afebrile, O2 sat within normal limits. Lungs are clear on exam.  -Tessalon or liquid Mucinex if needed for cough during the day, Hycodan cough syrup at night if needed and not wheezing or short of breath. Advised to use inhaler further symptoms, and RTC precautions were discussed.  -if persistent discolored nasal discharge next 1 week, consider antibiotic, but does not appear to be bacterial at this time.  Meds ordered this encounter  Medications  . HYDROcodone-homatropine (HYCODAN) 5-1.5 MG/5ML syrup    Sig: 45m by mouth a bedtime as needed for cough.    Dispense:  120 mL    Refill:  0  . benzonatate (TESSALON) 100 MG capsule    Sig: Take 1 capsule (100 mg total) by mouth 3 (three) times daily as needed for cough.    Dispense:  20 capsule    Refill:  0   Patient Instructions   At this point your symptoms still appear to be due to a virus, and possibly the influenza virus. Finish Tamiflu today, make sure you drink plenty of fluids, try saline/saltwater nasal spray 3-4 times per day for congestion. Try the technique we discussed to lessen possibility of medicine going down your throat causing you to feel sick.   For cough: Mucinex or Tessalon  Perles during the day, hydrocodone cough syrup at night if needed. Do not use that  cough syrup if you are short of breath or wheezing. Instead use albuterol for shortness of breath or wheezing and if albuterol does not help, be seen here or the emergency room.  If discolored nasal congestion is not improving within this next week, or any worsening symptoms including fever or more shortness of breath, return for recheck.    Cough, Adult Coughing is a reflex that clears your throat and your airways. Coughing helps to heal and protect your lungs. It is normal to cough occasionally, but a cough that happens with other symptoms or lasts a long time may be a sign of a condition that needs treatment. A cough may last only 2-3 weeks (acute), or it may last longer than 8 weeks (chronic).  What are the causes? Coughing is commonly caused by:  Breathing in substances that irritate your lungs.  A viral or bacterial respiratory infection.  Allergies.  Asthma.  Postnasal drip.  Smoking.  Acid backing up from the stomach into the esophagus (gastroesophageal reflux).  Certain medicines.  Chronic lung problems, including COPD (or rarely, lung cancer).  Other medical conditions such as heart failure. Follow these instructions at home: Pay attention to any changes in your symptoms. Take these actions to help with your discomfort:  Take medicines only as told by your health care provider.  If you were prescribed an antibiotic medicine, take it as told by your health care provider. Do not stop taking the antibiotic even if you start to feel better.  Talk with your health care provider before you take a cough suppressant medicine.  Drink enough fluid to keep your urine clear or pale yellow.  If the air is dry, use a cold steam vaporizer or humidifier in your bedroom or your home to help loosen secretions.  Avoid anything that causes you to cough at work or at home.  If your cough is worse at  night, try sleeping in a semi-upright position.  Avoid cigarette smoke. If you smoke, quit smoking. If you need help quitting, ask your health care provider.  Avoid caffeine.  Avoid alcohol.  Rest as needed. Contact a health care provider if:  You have new symptoms.  You cough up pus.  Your cough does not get better after 2-3 weeks, or your cough gets worse.  You cannot control your cough with suppressant medicines and you are losing sleep.  You develop pain that is getting worse or pain that is not controlled with pain medicines.  You have a fever.  You have unexplained weight loss.  You have night sweats. Get help right away if:  You cough up blood.  You have difficulty breathing.  Your heartbeat is very fast. This information is not intended to replace advice given to you by your health care provider. Make sure you discuss any questions you have with your health care provider. Document Released: 02/06/2011 Document Revised: 01/16/2016 Document Reviewed: 10/17/2014 Elsevier Interactive Patient Education  2017 [employer].     IF you received an x-Franco today, you will receive an invoice from [facility] Radiology. Please contact [facility]iology at [phone] with questions or concerns regarding your invoice.   IF you received labwork today, you will receive an invoice from [facility]. Please contact [facility] at [phone] with questions or concerns regarding your invoice.   Our billing staff will not be able to assist you with questions regarding bills from these companies.  You will be contacted with the lab results as soon as they are available. The fastest way to get your results is to activate your My Chart account. Instructions are located on the last page of this paperwork. If you have not heard from [place] regarding the results in 2 weeks, please contact this office.      I personally performed the services described in this documentation, which was  scribed in my presence. The recorded information has been reviewed and considered for accuracy and completeness, addended by me as needed, and agree with information above.  Signed,   Luke Ray, MD [facility].  09/26/16 11:55 AM

## 2017-01-11 ENCOUNTER — Encounter (HOSPITAL_COMMUNITY): Payer: Self-pay | Admitting: Emergency Medicine

## 2017-01-11 ENCOUNTER — Ambulatory Visit (INDEPENDENT_AMBULATORY_CARE_PROVIDER_SITE_OTHER): Payer: 59 | Admitting: Family Medicine

## 2017-01-11 ENCOUNTER — Encounter: Payer: Self-pay | Admitting: Family Medicine

## 2017-01-11 ENCOUNTER — Emergency Department (HOSPITAL_COMMUNITY)
Admission: EM | Admit: 2017-01-11 | Discharge: 2017-01-11 | Disposition: A | Discharge status: Home / Self Care | Payer: Self-pay | Attending: Emergency Medicine | Admitting: Emergency Medicine

## 2017-01-11 VITALS — BP 134/83 | HR 73 | Temp 98.3°F | Resp 16 | Ht 70.0 in | Wt 159.4 lb

## 2017-01-11 DIAGNOSIS — R1031 Right lower quadrant pain: Secondary | ICD-10-CM | POA: Diagnosis not present

## 2017-01-11 DIAGNOSIS — Y939 Activity, unspecified: Secondary | ICD-10-CM | POA: Insufficient documentation

## 2017-01-11 DIAGNOSIS — F17210 Nicotine dependence, cigarettes, uncomplicated: Secondary | ICD-10-CM | POA: Insufficient documentation

## 2017-01-11 DIAGNOSIS — S1096XA Insect bite of unspecified part of neck, initial encounter: Secondary | ICD-10-CM | POA: Insufficient documentation

## 2017-01-11 DIAGNOSIS — R410 Disorientation, unspecified: Secondary | ICD-10-CM | POA: Diagnosis not present

## 2017-01-11 DIAGNOSIS — R110 Nausea: Secondary | ICD-10-CM | POA: Diagnosis not present

## 2017-01-11 DIAGNOSIS — R197 Diarrhea, unspecified: Secondary | ICD-10-CM | POA: Diagnosis not present

## 2017-01-11 DIAGNOSIS — Y929 Unspecified place or not applicable: Secondary | ICD-10-CM | POA: Insufficient documentation

## 2017-01-11 DIAGNOSIS — R1013 Epigastric pain: Secondary | ICD-10-CM

## 2017-01-11 DIAGNOSIS — S30860A Insect bite (nonvenomous) of lower back and pelvis, initial encounter: Secondary | ICD-10-CM

## 2017-01-11 DIAGNOSIS — W57XXXA Bitten or stung by nonvenomous insect and other nonvenomous arthropods, initial encounter: Secondary | ICD-10-CM | POA: Insufficient documentation

## 2017-01-11 DIAGNOSIS — Y999 Unspecified external cause status: Secondary | ICD-10-CM | POA: Insufficient documentation

## 2017-01-11 DIAGNOSIS — M542 Cervicalgia: Secondary | ICD-10-CM | POA: Diagnosis not present

## 2017-01-11 DIAGNOSIS — H538 Other visual disturbances: Secondary | ICD-10-CM | POA: Diagnosis not present

## 2017-01-11 DIAGNOSIS — R42 Dizziness and giddiness: Secondary | ICD-10-CM | POA: Diagnosis not present

## 2017-01-11 DIAGNOSIS — S20461A Insect bite (nonvenomous) of right back wall of thorax, initial encounter: Secondary | ICD-10-CM | POA: Insufficient documentation

## 2017-01-11 LAB — POCT CBC
GRANULOCYTE PERCENT: 73.6 % (ref 37–80)
HCT, POC: 44.1 % (ref 43.5–53.7)
Hemoglobin: 15.2 g/dL (ref 14.1–18.1)
Lymph, poc: 2.6 (ref 0.6–3.4)
MCH: 31.9 pg — AB (ref 27–31.2)
MCHC: 34.4 g/dL (ref 31.8–35.4)
MCV: 92.8 fL (ref 80–97)
MID (cbc): 0.7 (ref 0–0.9)
MPV: 7.9 fL (ref 0–99.8)
PLATELET COUNT, POC: 231 10*3/uL (ref 142–424)
POC Granulocyte: 9.3 — AB (ref 2–6.9)
POC LYMPH PERCENT: 20.8 %L (ref 10–50)
POC MID %: 5.6 %M (ref 0–12)
RBC: 4.75 M/uL (ref 4.69–6.13)
RDW, POC: 13.8 %
WBC: 12.6 10*3/uL — AB (ref 4.6–10.2)

## 2017-01-11 LAB — GLUCOSE, POCT (MANUAL RESULT ENTRY): POC Glucose: 90 mg/dl (ref 70–99)

## 2017-01-11 MED ORDER — DOXYCYCLINE HYCLATE 100 MG PO CAPS: 100.0000 mg | ORAL_CAPSULE | Freq: Two times a day (BID) | ORAL | 0 refills | Status: DC

## 2017-01-11 NOTE — ED Provider Notes (Signed)
[facility] DEPT Provider Note   CSN: [id number] Arrival date & time: 01/11/17  1639  By signing my name below, Alexia Dara Lords, attest that this documentation has been prepared under the direction and in the presence of Davonna Belling, MD.  Electronically Signed: Wynelle Beckmann, [place] 01/11/2017. 7:16 PM.  History   Chief Complaint Chief Complaint  Patient presents with  . Tick Removal  . Generalized Body Aches   The history is provided by the patient. No language interpreter was used.   HPI Comments:  DARDEN FLEMISTER is a 61 y.o. male who presents to the Emergency Department complaining of constant neck pain with associated photophobia, eye pain, and nausea that began today. He pulled a tick from his right posterior chest wall 8-9 days ago and believes it was attached for about 1 day. He notes having numerous tick bites in the past but states this is the first time he had neck pain associated. He was seen at [place] and was advised to come to the ED. He denies neck stiffness.   Past Medical History:  Diagnosis Date  . Allergy     Patient Active Problem List   Diagnosis Date Noted  . Tobacco abuse 06/30/2013    Past Surgical History:  Procedure Laterality Date  . KIDNEY STONE SURGERY         Home Medications    Prior to Admission medications   Medication Sig Start Date End Date Taking? Authorizing Provider  ALPRAZolam (NIRAVAM) 0.5 MG dissolvable tablet Take 1 tablet (0.5 mg total) by mouth 2 (two) times daily as needed for anxiety. 12/04/15   Robyn Haber, MD  doxycycline (VIBRAMYCIN) 100 MG capsule Take 1 capsule (100 mg total) by mouth 2 (two) times daily. 01/11/17   Davonna Belling, MD    Family History Family History  Problem Relation Age of Onset  . Diabetes Mother   . Heart disease Mother   . Diabetes Brother     Social History Social History  Substance Use Topics  . Smoking status: Current Every Day Smoker    Packs/day: 1.00    Years:  25.00    Types: Cigarettes  . Smokeless tobacco: Never Used  . Alcohol use No     Allergies   Amoxicillin   Review of Systems Review of Systems  Eyes: Positive for photophobia and visual disturbance.  Gastrointestinal: Positive for nausea.  Musculoskeletal: Positive for neck pain. Negative for neck stiffness.  Neurological: Positive for dizziness.     Physical Exam Updated Vital Signs BP (!) 147/78 (BP Location: Right Arm)   Pulse 72   Temp 98.4 F (36.9 C) (Oral)   Resp 18   SpO2 98%   Physical Exam  Constitutional: He appears well-developed and well-nourished. No distress.  HENT:  Head: Normocephalic and atraumatic.  Eyes: Conjunctivae are normal. Pupils are equal, round, and reactive to light.  Neck: Normal range of motion. Neck supple.  No meningismus. No pain in neck with straight leg raise  Cardiovascular: Normal rate.   Pulmonary/Chest: Effort normal and breath sounds normal. No respiratory distress. He has no wheezes. He has no rales.  Abdominal: Soft. There is no tenderness.  Musculoskeletal: Normal range of motion.  Right posterior chest wall with 1 cm area of erythema with central scabbed area where the bite was. No right lesions. No rash on palms. No peripheral edema.  Neurological: He is alert.  Awake and appropriate. Face is symmetrical  Skin: Skin is warm and dry.  Psychiatric: He  has a normal mood and affect.  Nursing note and vitals reviewed.    ED Treatments / Results  COORDINATION OF CARE:  7:17 PM Discussed treatment plan with pt at bedside and pt agreed to plan.  Labs (all labs ordered are listed, but only abnormal results are displayed) Labs Reviewed  ROCKY MTN SPOTTED FVR ABS PNL(IGG+IGM)  B. BURGDORFI ANTIBODIES    EKG  EKG Interpretation None       Radiology No results found.  Procedures Procedures (including critical care time)  Medications Ordered in ED Medications - No data to display   Initial Impression /  Assessment and Plan / ED Course  I have reviewed the triage vital signs and the nursing notes.  Pertinent labs & imaging results that were available during my care of the patient were reviewed by me and considered in my medical decision making (see chart for details).     Patient with recent tick bite. Has been feeling little bad recently with headaches mild photophobia and nausea dizziness. States it feels like when he has had Lyme disease in the past. Has 1 red area in the back but no real other rash. Sent in with concerns for meningitis. At this point I think he does not have a meningitis. There is no meningismus he is well-appearing. Mildly elevated white count. Lyme and [facility] spotted antibody sent. Will give empiric doxycycline. After discussion with the family will not get lumbar puncture at this time. Discharge home.  Final Clinical Impressions(s) / ED Diagnoses   Final diagnoses:  Tick bite of back, initial encounter    New Prescriptions New Prescriptions   DOXYCYCLINE (VIBRAMYCIN) 100 MG CAPSULE    Take 1 capsule (100 mg total) by mouth 2 (two) times daily.   I personally performed the services described in this documentation, which was scribed in my presence. The recorded information has been reviewed and is accurate.       Davonna Belling, MD 01/11/17 2031

## 2017-01-11 NOTE — Patient Instructions (Addendum)
Based on recent tick bite in your current symptoms today, I am concerned about a possible tickborne illness. That could include Lyme disease versus [facility] spotted fever. With your new blurry vision, dizziness, neck pain, and headache, you will need further testing through the emergency room, and possible lumbar puncture depending on evaluation.  Proceed to [facility] as soon as possible today for further evaluation.   Please follow up to discuss anxiety further as other medicine/treatment may be more effective.    IF you received an x-ray today, you will receive an invoice from [facility]iology. Please contact [facility] Radiology at [phone] with questions or concerns regarding your invoice.   IF you received labwork today, you will receive an invoice from [place]. Please contact [facility] at [phone] with questions or concerns regarding your invoice.   Our billing staff will not be able to assist you with questions regarding bills from these companies.  You will be contacted with the lab results as soon as they are available. The fastest way to get your results is to activate your My Chart account. Instructions are located on the last page of this paperwork. If you have not heard from [place] regarding the results in 2 weeks, please contact this office.

## 2017-01-11 NOTE — Discharge Instructions (Signed)
You easily could have a tickborne illness. You feel bad after tick bite. I do not however think this is a meningitis. Lyme and [facility] spotted fever lab work has been sent and can be followed. Return for worsening symptoms.

## 2017-01-11 NOTE — ED Triage Notes (Signed)
Pt st's he removed a tick from his right shoulder 3 days ago now c/o generalized body aches and headache,  Pt st's he has had lyme disease in the past and feels the same.

## 2017-01-11 NOTE — Progress Notes (Addendum)
Subjective:  By signing my name below, I, Luke Franco, attest that this documentation has been prepared under the direction and in the presence of Luke Ray, MD. Electronically Signed: Moises Franco, [place]. 01/11/2017 , 12:46 PM .  Patient was seen in Room 5 .   Patient ID: Luke Franco, male    DOB: April 23, 1956, 61 y.o.   MRN: [id number] Chief Complaint  Patient presents with  . Insect Bite    X 2 weeks ago - on right flank   . Neck Pain    neck muscles sore  . Nausea  . joint pain  . Medication Refill    Alprazolam    HPI Luke Franco is a 61 y.o. male Here for multiple concerns today.   Tick Bite Patient informs having a history of Lyme disease. He has been seen by a specialist in [place]. He was evaluated for fatigue in 2013 for recurrence of Lyme disease. He had lyme antibody titer that was negative; initial testing was positive, but appears Western Blot test was negative. Lyme testing negative in 2013.   He was last seen for a tick bite by Dr. Joseph Franco in March 2016, treated with doxycycline. He had a 13mm ulceration with 13mm surrounding erythema.   He reports taking 1 leftover antibiotic (doxycycline) yesterday. He has leftover antibiotics because it caused him to stomach issues when he last took it.   Patient reports pulling off a tick about 8 days ago, located over his right upper back. He believes the tick has been there for about 2-3 days. He describes the affected area becoming itchy after he pulled it off. Starting 2-3 days ago, he started feeling associated symptoms of neck stiffness/pain, nausea and disoriented with blurred vision. He has been feeling fatigue recently as well.   He describes his disorientation with blurry vision of "just not feeling right". He notes having some photosensitivity and headache with his blurry vision over the past 2 days. He also reports tingling in his fingers. He does mention having some shortness of breath with this. He  denies chest pain or chest tightness. He denies history of heart attack or blocked arteries. He is a current smoker. He denies slurred speech.   He also complains having diarrhea and upper mid abdominal pain. He's been having diarrhea twice a day. He's been able to drink fluids. He denies history of peptic ulcers. He denies alcohol use. He denies vomiting. He denies sick contact of similar symptoms around him. He denies history of diverticulitis.    Anxiety / Medication refill He was last prescribed in April 2017 by Dr. Joseph Franco for generalized anxiety disorder; #60 with 3 refills 0.5mg  to be taken up to twice a day.   He states having 10 tablets left. He's been taking it as needed, but recently taking it about everyday. He would wake up in the middle of the night with anxiety. He feels tired and "thick headed" the next day after taking it.   Patient Active Problem List   Diagnosis Date Noted  . Tobacco abuse 06/30/2013   Past Medical History:  Diagnosis Date  . Allergy    Past Surgical History:  Procedure Laterality Date  . KIDNEY STONE SURGERY     Allergies  Allergen Reactions  . Amoxicillin     vomiting   Prior to Admission medications   Medication Sig Start Date End Date Taking? Authorizing Provider  albuterol (PROVENTIL HFA;VENTOLIN HFA) 108 (90 Base) MCG/ACT inhaler Inhale 1-2 puffs into  the lungs every 4 (four) hours as needed for wheezing or shortness of breath. 09/22/16   Luke Agreste, MD  ALPRAZolam (NIRAVAM) 0.5 MG dissolvable tablet Take 1 tablet (0.5 mg total) by mouth 2 (two) times daily as needed for anxiety. 12/04/15   Luke Haber, MD  benzonatate (TESSALON) 100 MG capsule Take 1 capsule (100 mg total) by mouth 3 (three) times daily as needed for cough. 09/26/16   Luke Agreste, MD  HYDROcodone-homatropine [facility]) 5-1.5 MG/5ML syrup 94m by mouth a bedtime as needed for cough. 09/26/16   Luke Agreste, MD  oseltamivir (TAMIFLU) 75 MG capsule Take 1  capsule (75 mg total) by mouth 2 (two) times daily. 09/22/16   Luke Agreste, MD   Social History   Social History  . Marital status: Married    Spouse name: N/A  . Number of children: N/A  . Years of education: N/A   Occupational History  . Not on file.   Social History Main Topics  . Smoking status: Current Every Day Smoker    Packs/day: 1.00    Years: 25.00    Types: Cigarettes  . Smokeless tobacco: Never Used  . Alcohol use No  . Drug use: No  . Sexual activity: Not on file   Other Topics Concern  . Not on file   Social History Narrative  . No narrative on file   Review of Systems  Constitutional: Positive for fatigue. Negative for chills and fever.  Eyes: Positive for photophobia and visual disturbance.  Respiratory: Positive for shortness of breath. Negative for chest tightness.   Cardiovascular: Negative for chest pain.  Gastrointestinal: Positive for abdominal pain, diarrhea and nausea. Negative for vomiting.  Musculoskeletal: Positive for arthralgias, neck pain and neck stiffness. Negative for gait problem.  Skin: Positive for rash and wound.  Neurological: Positive for dizziness and headaches. Negative for speech difficulty.  Psychiatric/Behavioral: The patient is nervous/anxious.        Objective:   Physical Exam  Constitutional: He is oriented to person, place, and time. He appears well-developed and well-nourished.  Non-toxic appearance. No distress.  HENT:  Head: Normocephalic and atraumatic.  Eyes: EOM are normal. Pupils are equal, round, and reactive to light. Right eye exhibits normal extraocular motion and no nystagmus. Left eye exhibits normal extraocular motion and no nystagmus.  Neck: Neck supple.  Cardiovascular: Normal rate.   Pulmonary/Chest: Effort normal. No respiratory distress.  Abdominal: There is tenderness in the right upper quadrant and epigastric area. There is negative Murphy's sign.  Minimal epigastric tenderness, but more  tender RUQ than RLQ; some tenderness over McBurney's point  Musculoskeletal: Normal range of motion.  C-spine: paraspinals, R>L, as well as midline upper C-spine Complains of neck discomfort, but is moving neck without apparent difficulty  Neurological: He is alert and oriented to person, place, and time. He displays a negative Romberg sign. Coordination and gait normal.  No pronator drift, normal finger-to-nose testing; upper and lower extremities strength 5/5  Skin: Skin is warm and dry.  Area of previous tick removal, there is a small scab centrally, few excoriated marks from scratches; minimal surrounding erythema, appears to be healing skin 2-68mm, no target lesion  Psychiatric: He has a normal mood and affect. His behavior is normal.  Nursing note and vitals reviewed.   Vitals:   01/11/17 1209  BP: (!) 159/79  Pulse: 73  Resp: 16  Temp: 98.3 F (36.8 C)  TempSrc: Oral  SpO2: 97%  Weight: 159  lb 6.4 oz (72.3 kg)  Height: 5\' 10"  (1.778 m)   EKG: sinus rhythm, no acute findings, rate 67 Results for orders placed or performed in visit on 01/11/17  POCT glucose (manual entry)  Result Value Ref Range   POC Glucose 90 70 - 99 mg/dl  POCT CBC  Result Value Ref Range   WBC 12.6 (A) 4.6 - 10.2 K/uL   Lymph, poc 2.6 0.6 - 3.4   POC LYMPH PERCENT 20.8 10 - 50 %L   MID (cbc) 0.7 0 - 0.9   POC MID % 5.6 0 - 12 %M   POC Granulocyte 9.3 (A) 2 - 6.9   Granulocyte percent 73.6 37 - 80 %G   RBC 4.75 4.69 - 6.13 M/uL   Hemoglobin 15.2 14.1 - 18.1 g/dL   HCT, POC 44.1 43.5 - 53.7 %   MCV 92.8 80 - 97 fL   MCH, POC 31.9 (A) 27 - 31.2 pg   MCHC 34.4 31.8 - 35.4 g/dL   RDW, POC 13.8 %   Platelet Count, POC 231 142 - 424 K/uL   MPV 7.9 0 - 99.8 fL   Orthostatic VS for the past 24 hrs (Last 3 readings):  BP- Lying Pulse- Lying BP- Sitting Pulse- Sitting BP- Standing at 0 minutes Pulse- Standing at 0 minutes  01/11/17 1259 149/80 63 (!) 154/95 67 160/87 72      Assessment & Plan:    Luke Franco is a 61 y.o. male Tick bite, initial encounter  Neck pain  Blurry vision - Plan: POCT glucose (manual entry)  Nausea without vomiting - Plan: POCT CBC  Epigastric pain - Plan: POCT CBC  Diarrhea, unspecified type  Disorientation - Plan: POCT glucose (manual entry), POCT CBC, EKG 12-Lead  Dizziness - Plan: POCT glucose (manual entry), EKG 12-Lead, Orthostatic vital signs  RLQ abdominal pain - Plan: POCT CBC  Headache, neck pain, dizziness, blurry vision after tick bite approximately 8 days ago. Symptoms have been progressive. Leukocytosis noted. Reports history of Lyme disease, but previous Lyme testing appeared negative on confirmation. Currently I am concerned about possible [facility] spotted fever versus other tickborne illness, and with progression to above symptoms, may need further evaluation including lumbar puncture and potential inpatient treatment.  - Advised to be seen immediately in the emergency room. Initially he asked if he could go tomorrow, stressed importance of immediate evaluation in the emergency room today and risk of progression of illness and possible death if not treated appropriately or in a timely manner. Understanding expressed.  -As he will be evaluated through emergency room, I did not draw RMSF or Lyme titers in the office today, and did not start doxycycline.     -Advised nurse first at [facility].   No orders of the defined types were placed in this encounter.  Patient Instructions   Based on recent tick bite in your current symptoms today, I am concerned about a possible tickborne illness. That could include Lyme disease versus [facility] spotted fever. With your new blurry vision, dizziness, neck pain, and headache, you will need further testing through the emergency room, and possible lumbar puncture depending on evaluation.  Proceed to [facility] as soon as possible today for further evaluation.   Please follow up to discuss  anxiety further as other medicine/treatment may be more effective.    IF you received an x-Franco today, you will receive an invoice from [facility]. Please contact [facility] at [phone] with questions or  concerns regarding your invoice.   IF you received labwork today, you will receive an invoice from [facility]. Please contact [facility] at [phone] with questions or concerns regarding your invoice.   Our billing staff will not be able to assist you with questions regarding bills from these companies.  You will be contacted with the lab results as soon as they are available. The fastest way to get your results is to activate your My Chart account. Instructions are located on the last page of this paperwork. If you have not heard from [place] regarding the results in 2 weeks, please contact this office.       I personally performed the services described in this documentation, which was scribed in my presence. The recorded information has been reviewed and considered for accuracy and completeness, addended by me as needed, and agree with information above.  Signed,   Luke Ray, MD [facility] [place].  01/11/17 1:50 PM

## 2017-01-13 LAB — ROCKY MTN SPOTTED FVR ABS PNL(IGG+IGM)
RMSF IGM: 0.27 {index} (ref 0.00–0.89)
RMSF IgG: NEGATIVE

## 2017-01-13 LAB — B. BURGDORFI ANTIBODIES: B burgdorferi Ab IgG+IgM: <0.91 {ISR} (ref 0.00–0.90)

## 2017-01-19 NOTE — ED Notes (Signed)
Pt. Called and left a message on this RN office phone for results to RMSF.  Called pt. Back and his RMSF is negative.

## 2017-02-01 NOTE — ED Notes (Signed)
Returned pt.s phone call . Message left on provided number.

## 2017-02-06 ENCOUNTER — Ambulatory Visit (INDEPENDENT_AMBULATORY_CARE_PROVIDER_SITE_OTHER): Payer: 59 | Admitting: Family Medicine

## 2017-02-06 ENCOUNTER — Encounter: Payer: Self-pay | Admitting: Family Medicine

## 2017-02-06 VITALS — BP 136/77 | HR 82 | Temp 98.0°F | Resp 97 | Ht 70.0 in | Wt 156.6 lb

## 2017-02-06 DIAGNOSIS — H538 Other visual disturbances: Secondary | ICD-10-CM | POA: Diagnosis not present

## 2017-02-06 DIAGNOSIS — R1013 Epigastric pain: Secondary | ICD-10-CM | POA: Diagnosis not present

## 2017-02-06 DIAGNOSIS — F4322 Adjustment disorder with anxiety: Secondary | ICD-10-CM

## 2017-02-06 DIAGNOSIS — R51 Headache: Secondary | ICD-10-CM | POA: Diagnosis not present

## 2017-02-06 DIAGNOSIS — R350 Frequency of micturition: Secondary | ICD-10-CM | POA: Diagnosis not present

## 2017-02-06 DIAGNOSIS — R519 Headache, unspecified: Secondary | ICD-10-CM

## 2017-02-06 LAB — POCT CBC
GRANULOCYTE PERCENT: 64.1 % (ref 37–80)
HCT, POC: 43.7 % (ref 43.5–53.7)
Hemoglobin: 15.7 g/dL (ref 14.1–18.1)
Lymph, poc: 2.6 (ref 0.6–3.4)
MCH, POC: 32.7 pg — AB (ref 27–31.2)
MCHC: 36 g/dL — AB (ref 31.8–35.4)
MCV: 90.9 fL (ref 80–97)
MID (CBC): 0.5 (ref 0–0.9)
MPV: 8.3 fL (ref 0–99.8)
PLATELET COUNT, POC: 246 10*3/uL (ref 142–424)
POC Granulocyte: 5.4 (ref 2–6.9)
POC LYMPH %: 30.1 % (ref 10–50)
POC MID %: 5.8 %M (ref 0–12)
RBC: 4.81 M/uL (ref 4.69–6.13)
RDW, POC: 13.5 %
WBC: 8.5 10*3/uL (ref 4.6–10.2)

## 2017-02-06 LAB — POCT URINALYSIS DIP (MANUAL ENTRY)
Bilirubin, UA: NEGATIVE
Blood, UA: NEGATIVE
GLUCOSE UA: NEGATIVE mg/dL
Ketones, POC UA: NEGATIVE mg/dL
Leukocytes, UA: NEGATIVE
Nitrite, UA: NEGATIVE
Protein Ur, POC: NEGATIVE mg/dL
SPEC GRAV UA: 1.01 (ref 1.010–1.025)
UROBILINOGEN UA: 0.2 U/dL
pH, UA: 7.5 (ref 5.0–8.0)

## 2017-02-06 LAB — POC MICROSCOPIC URINALYSIS (UMFC): MUCUS RE: ABSENT

## 2017-02-06 LAB — GLUCOSE, POCT (MANUAL RESULT ENTRY): POC GLUCOSE: 110 mg/dL — AB (ref 70–99)

## 2017-02-06 MED ORDER — HYDROXYZINE HCL 10 MG PO TABS: 5.0000 mg | ORAL_TABLET | Freq: Every evening | ORAL | 0 refills | Status: DC | PRN

## 2017-02-06 MED ORDER — OMEPRAZOLE 20 MG PO CPDR: 20.0000 mg | DELAYED_RELEASE_CAPSULE | Freq: Every day | ORAL | 3 refills | Status: DC

## 2017-02-06 NOTE — Patient Instructions (Addendum)
Call your eye care provider for appointment as soon as possible to check your blurry vision and pressure in your head.  Blood sugar was only borderline elevated - does not appear to be cause of your symptoms. [facility] is an option if you need a new provider. [facility] is also in [place]. If any worsening headache or blurry version, or weakness, go to emergency room or call 911.   Vistaril 1/2-1 pill at bedtime as needed for anxiety or trouble getting to sleep. See information below on adjustment disorder. I would recommend counseling if the symptoms persist. Follow-up within 1 week to discuss those symptoms further.  Start omeprazole once per day for your abdominal pain, and I will refer you to a gastroenterologist.  Return to the clinic or go to the nearest emergency room if any of your symptoms worsen or new symptoms occur.   Abdominal Pain, Adult Abdominal pain can be caused by many things. Often, abdominal pain is not serious and it gets better with no treatment or by being treated at home. However, sometimes abdominal pain is serious. Your health care provider will do a medical history and a physical exam to try to determine the cause of your abdominal pain. Follow these instructions at home:  Take over-the-counter and prescription medicines only as told by your health care provider. Do not take a laxative unless told by your health care provider.  Drink enough fluid to keep your urine clear or pale yellow.  Watch your condition for any changes.  Keep all follow-up visits as told by your health care provider. This is important. Contact a health care provider if:  Your abdominal pain changes or gets worse.  You are not hungry or you lose weight without trying.  You are constipated or have diarrhea for more than 2-3 days.  You have pain when you urinate or have a bowel movement.  Your abdominal pain wakes you up at night.  Your pain gets worse with meals, after  eating, or with certain foods.  You are throwing up and cannot keep anything down.  You have a fever. Get help right away if:  Your pain does not go away as soon as your health care provider told you to expect.  You cannot stop throwing up.  Your pain is only in areas of the abdomen, such as the right side or the left lower portion of the abdomen.  You have bloody or black stools, or stools that look like tar.  You have severe pain, cramping, or bloating in your abdomen.  You have signs of dehydration, such as: ? Dark urine, very little urine, or no urine. ? Cracked lips. ? Dry mouth. ? Sunken eyes. ? Sleepiness. ? Weakness. This information is not intended to replace advice given to you by your health care provider. Make sure you discuss any questions you have with your health care provider. Document Released: 05/20/2005 Document Revised: 02/28/2016 Document Reviewed: 01/22/2016 Elsevier Interactive Patient Education  2017 [place] Having blurred vision means that you cannot see things clearly. Your vision may seem fuzzy or out of focus. Blurred vision is a very common symptom of an eye or vision problem. Blurred vision is often a gradual blur that occurs in one eye or both eyes. There are many causes of blurred vision, including cataracts, macular degeneration, and diabetic retinopathy. Blurred vision can be diagnosed based on your symptoms and a physical exam. Tell your health care provider about  any other health problems you have, any recent eye injury, and any prior surgeries. You may need to see a health care provider who specializes in eye problems (ophthalmologist). Your treatment depends on what is causing your blurred vision.  HOME CARE INSTRUCTIONS  Tell your health care provider about any changes in your blurred vision.  Do not drive or operate heavy machinery if your vision is blurry.  Keep all follow-up visits as directed by your health care  provider. This is important. SEEK MEDICAL CARE IF:  Your symptoms get worse.  You have new symptoms.  You have trouble seeing at night.  You have trouble seeing up close or far away.  You have trouble noticing the difference between colors. SEEK IMMEDIATE MEDICAL CARE IF:  You have severe eye pain.  You have a severe headache.  You have flashing lights in your field of vision.  You have a sudden change in vision.  You have a sudden loss of vision.  You have vision change after an injury.  You notice drainage coming from your eyes.  You notice a rash around your eyes. This information is not intended to replace advice given to you by your health care provider. Make sure you discuss any questions you have with your health care provider. Document Released: 08/13/2003 Document Revised: 12/25/2014 Document Reviewed: 07/04/2014 Elsevier Interactive Patient Education  2017 [place] Headache Without Cause A headache is pain or discomfort felt around the head or neck area. The specific cause of a headache may not be found. There are many causes and types of headaches. A few common ones are:  Tension headaches.  Migraine headaches.  Cluster headaches.  Chronic daily headaches.  Follow these instructions at home: Watch your condition for any changes. Take these steps to help with your condition: Managing pain  Take over-the-counter and prescription medicines only as told by your health care provider.  Lie down in a dark, quiet room when you have a headache.  If directed, apply ice to the head and neck area: ? Put ice in a plastic bag. ? Place a towel between your skin and the bag. ? Leave the ice on for 20 minutes, 2-3 times per day.  Use a heating pad or hot shower to apply heat to the head and neck area as told by your health care provider.  Keep lights dim if bright lights bother you or make your headaches worse. Eating and drinking  Eat meals on a  regular schedule.  Limit alcohol use.  Decrease the amount of caffeine you drink, or stop drinking caffeine. General instructions  Keep all follow-up visits as told by your health care provider. This is important.  Keep a headache journal to help find out what may trigger your headaches. For example, write down: ? What you eat and drink. ? How much sleep you get. ? Any change to your diet or medicines.  Try massage or other relaxation techniques.  Limit stress.  Sit up straight, and do not tense your muscles.  Do not use tobacco products, including cigarettes, chewing tobacco, or e-cigarettes. If you need help quitting, ask your health care provider.  Exercise regularly as told by your health care provider.  Sleep on a regular schedule. Get 7-9 hours of sleep, or the amount recommended by your health care provider. Contact a health care provider if:  Your symptoms are not helped by medicine.  You have a headache that is different from the usual headache.  You have nausea or you vomit.  You have a fever. Get help right away if:  Your headache becomes severe.  You have repeated vomiting.  You have a stiff neck.  You have a loss of vision.  You have problems with speech.  You have pain in the eye or ear.  You have muscular weakness or loss of muscle control.  You lose your balance or have trouble walking.  You feel faint or pass out.  You have confusion. This information is not intended to replace advice given to you by your health care provider. Make sure you discuss any questions you have with your health care provider. Document Released: 08/10/2005 Document Revised: 01/16/2016 Document Reviewed: 12/03/2014 Elsevier Interactive Patient Education  2017 [employer]  Adjustment Disorder, Adult Adjustment disorder is a group of symptoms that can develop after a stressful life event, such as the loss of a job or serious physical illness. The symptoms can  affect how you feel, think, and act. They may interfere with your relationships. Adjustment disorder increases your risk of suicide and substance abuse. If this disorder is not managed early, it can develop into a more serious condition, such as major depressive disorder or post-traumatic stress disorder. What are the causes? This condition happens when you have trouble recovering from or coping with a stressful life event. What increases the risk? You are more likely to develop this condition if:  You have had depression or anxiety.  You are being treated for a long-term (chronic) illness.  You are being treated for an illness that cannot be cured (terminal illness).  You have a family history of mental illness.  What are the signs or symptoms? Symptoms of this condition include:  Extreme trouble doing daily tasks, such as going to work.  Sadness, depression, or crying spells.  Worrying a lot.  Loss of enjoyment.  Change in appetite or weight.  Feelings of loss or hopelessness.  Thoughts of suicide.  Anxiety, worry, or nervousness.  Trouble sleeping.  Avoiding family and friends.  Fighting or vandalism.  Complaining of feeling sick without being ill.  Feeling dazed or disconnected.  Nightmares.  Trouble sleeping.  Irritability.  Reckless driving.  Poor work Systems analyst.  Ignoring bills.  Symptoms of this condition start within three months of the stressful event. They do not last more than six months, unless the stressful circumstances last longer. Normal grieving after the death of a loved one is not a symptom of this condition. How is this diagnosed? To diagnose this condition, your health care provider will ask about what has happened in your life and how it has affected you. He or she may also ask about your medical history and your use of medicines, alcohol, and other substances. Your health care provider may do a physical exam and order lab tests or  other studies. You may be referred to a mental health specialist. How is this treated? Treatment options for this condition include:  Counseling or talk therapy. Talk therapy is usually provided by mental health specialists.  Medicines. Certain medicines may help with depression, anxiety, and sleep.  Support groups. These offer emotional support, advice, and guidance. They are made up of people who have had similar experiences.  Observation and time. This is sometimes called "watchful waiting." In this treatment, health care providers monitor your health and behavior without other treatment. Adjustment disorder sometimes gets better on its own with time.  Follow these instructions at home:  Take over-the-counter and prescription  medicines only as told by your health care provider.  Keep all follow-up visits as told by your health care provider. This is important. Contact a health care provider if:  Your symptoms do not improve in six months.  Your symptoms get worse. Get help right away if:  You have serious thoughts about hurting yourself or someone else. If you ever feel like you may hurt yourself or others, or have thoughts about taking your own life, get help right away. You can go to your nearest emergency department or call:  Your local emergency services (911 in the [place]  A suicide crisis helpline, such as the [place] at 430-846-1759. This is open 24 hours a day.  Summary  Adjustment disorder is a group of symptoms that can develop after a stressful life event, such as the loss of a job or serious physical illness. The symptoms can affect how you feel, think, and act. They may interfere with your relationships.  Symptoms of this condition start within three months of the stressful event. They do not last more than six months, unless the stressful circumstances last longer.  Treatment may include talk therapy, medicines, participation in a  support group, or observation to see if symptoms improve.  Contact your health care provider if your symptoms get worse or do not improve in six months.  If you ever feel like you may hurt yourself or others, or have thoughts about taking your own life, get help right away. This information is not intended to replace advice given to you by your health care provider. Make sure you discuss any questions you have with your health care provider. Document Released: 04/14/2006 Document Revised: 10/09/2016 Document Reviewed: 10/09/2016 Elsevier Interactive Patient Education  2018 [employer].   IF you received an x-ray today, you will receive an invoice from [facility]. Please contact [facility]iology at [phone] with questions or concerns regarding your invoice.   IF you received labwork today, you will receive an invoice from [facility]. Please contact [facility] at [phone] with questions or concerns regarding your invoice.   Our billing staff will not be able to assist you with questions regarding bills from these companies.  You will be contacted with the lab results as soon as they are available. The fastest way to get your results is to activate your My Chart account. Instructions are located on the last page of this paperwork. If you have not heard from [place] regarding the results in 2 weeks, please contact this office.

## 2017-02-06 NOTE — Progress Notes (Signed)
By signing my name below, I, Mesha Guinyard, attest that this documentation has been prepared under the direction and in the presence of [name], MD.  Electronically Signed: Verlee Monte, Medical Scribe. 02/06/17. 9:21 AM.  Subjective:    Patient ID: Luke Franco, male    DOB: 09/17/1955, 61 y.o.   MRN: [id number]  HPI Chief Complaint  Patient presents with  . Abdominal Pain    possible due from tick bite 4 weeks ago     HPI Comments: Luke Franco is a 61 y.o. male who presents to Primary Care at [facility] complaining of abdominal pain onset 4 weeks. He was seen for a tick bite May 21st with other sxs at that time. See details regarding previous lyme testing in 2013. Reports sxs started after tick bite He was having some diarrhea and upper abdominal pain May 21st. WBCs were 12.6, he was having HA neck pain, dizziness and blurry vision as well. He was sent to the ER to further evaluated tick borne illness. He was started on doxycycline. Did not appear to have meningitis, no lumber puncture was performed. Lyme titer was negative, RMSF was negative  Abdominal Pain: Pt felt better 7-8 days after taking medication, but he is not back to baseline with upper abdominal pain, pressure behind his eyes, and head congestion. He reports associated sxs of nausea, hunger pains, a weird taste describes as a sore throat taste in his mouth, frequently clearing his throat, intermittent night sweats for years, and tremors. Pt has also been sneezing a few time but hasn't had treatment. Abdominal pain is alleviated with food consumption. Reports he had emesis for a week in May when he had a tick bite, but not recently. He notes every time he has abdominal pain, he has head "problems". Pt smokes but he's trying to quit and he doesn't drink alcohol. Pt has never had a colonoscopy and works outside. Denies lower abdominal pain, weight loss, emesis, fever, diarrhea, and  PMHx of GERD or stomach  ulcer.  Anxiety: Pt was on anxiety medication PRN in the past, but he has not recently taken anxiety medication. Pt likes to cut it into fours since he feels like his head "feels thick" the next day and he has trouble going to sleep while on it. Pt has been feeling more anxious and irritable since becoming sick. Pt doesn't want his illness to effect his work since he's had a perfect work record. Pt's wife reports he's only ill towards her and she feels like she's walking on egg shells. They have went to counseling in the past but they felt like it was a "joke".  HA: Intermittent pressure behind his eyes with blurry vision since May 2018. Occurs almost everyday, and his HA lingers but his blurry vision last either half a day or the whole day. Pt can't remember his last ophthalmologist appt - "it's been a while". Pt has been drinking more water and urinating more since May. Denies weakness.  Patient Active Problem List   Diagnosis Date Noted  . Tobacco abuse 06/30/2013   Past Medical History:  Diagnosis Date  . Allergy    Past Surgical History:  Procedure Laterality Date  . KIDNEY STONE SURGERY     Allergies  Allergen Reactions  . Amoxicillin     vomiting   Prior to Admission medications   Medication Sig Start Date End Date Taking? Authorizing Provider  ALPRAZolam (NIRAVAM) 0.5 MG dissolvable tablet Take 1 tablet (0.5 mg total)  by mouth 2 (two) times daily as needed for anxiety. 12/04/15   Robyn Haber, MD   Social History   Social History  . Marital status: Married    Spouse name: N/A  . Number of children: N/A  . Years of education: N/A   Occupational History  . Not on file.   Social History Main Topics  . Smoking status: Current Every Day Smoker    Packs/day: 1.00    Years: 25.00    Types: Cigarettes  . Smokeless tobacco: Never Used  . Alcohol use No  . Drug use: No  . Sexual activity: Not on file   Other Topics Concern  . Not on file   Social History Narrative   . No narrative on file   Review of Systems  Constitutional: Positive for diaphoresis. Negative for fever and unexpected weight change.  HENT: Positive for congestion and sneezing.   Eyes: Positive for pain and visual disturbance.  Gastrointestinal: Positive for abdominal pain and nausea. Negative for blood in stool, diarrhea and vomiting.  Endocrine: Positive for polydipsia and polyuria.  Neurological: Positive for tremors and headaches. Negative for weakness.  Psychiatric/Behavioral: Positive for agitation. The patient is nervous/anxious.     Objective:  Physical Exam  Constitutional: He appears well-developed and well-nourished. No distress.  HENT:  Head: Normocephalic and atraumatic.  Eyes: Conjunctivae are normal.  Neck: Neck supple.  Cardiovascular: Normal rate.   Pulmonary/Chest: Effort normal.  Abdominal: There is tenderness in the right lower quadrant and epigastric area. There is no tenderness at McBurney's point and negative Murphy's sign.  Neurological: He is alert. He displays a negative Romberg sign.  No pronator drift Nl heel to toe  Skin: Skin is warm and dry.  Psychiatric: He has a normal mood and affect. His behavior is normal.  Nursing note and vitals reviewed.   Vitals:   02/06/17 0844  BP: 136/77  Pulse: 82  Resp: (!) 97  Temp: 98 F (36.7 C)  TempSrc: Oral  Weight: 156 lb 9.6 oz (71 kg)  Height: 5\' 10"  (1.778 m)  Body mass index is 22.47 kg/m.   Results for orders placed or performed in visit on 02/06/17  POCT glucose (manual entry)  Result Value Ref Range   POC Glucose 110 (A) 70 - 99 mg/dl  POCT CBC  Result Value Ref Range   WBC 8.5 4.6 - 10.2 K/uL   Lymph, poc 2.6 0.6 - 3.4   POC LYMPH PERCENT 30.1 10 - 50 %L   MID (cbc) 0.5 0 - 0.9   POC MID % 5.8 0 - 12 %M   POC Granulocyte 5.4 2 - 6.9   Granulocyte percent 64.1 37 - 80 %G   RBC 4.81 4.69 - 6.13 M/uL   Hemoglobin 15.7 14.1 - 18.1 g/dL   HCT, POC 43.7 43.5 - 53.7 %   MCV 90.9 80 - 97  fL   MCH, POC 32.7 (A) 27 - 31.2 pg   MCHC 36.0 (A) 31.8 - 35.4 g/dL   RDW, POC 13.5 %   Platelet Count, POC 246 142 - 424 K/uL   MPV 8.3 0 - 99.8 fL  POCT urinalysis dipstick  Result Value Ref Range   Color, UA yellow yellow   Clarity, UA clear clear   Glucose, UA negative negative mg/dL   Bilirubin, UA negative negative   Ketones, POC UA negative negative mg/dL   Spec Grav, UA 1.010 1.010 - 1.025   Blood, UA negative negative  pH, UA 7.5 5.0 - 8.0   Protein Ur, POC negative negative mg/dL   Urobilinogen, UA 0.2 0.2 or 1.0 E.U./dL   Nitrite, UA Negative Negative   Leukocytes, UA Negative Negative  POCT Microscopic Urinalysis ([facility])  Result Value Ref Range   WBC,UR,HPF,POC None None WBC/hpf   RBC,UR,HPF,POC None None RBC/hpf   Bacteria None None, Too numerous to count   Mucus Absent Absent   Epithelial Cells, UR Per Microscopy None None, Too numerous to count cells/hpf   Assessment & Plan:    Luke Franco is a 61 y.o. male Abdominal pain, epigastric - Plan: Comprehensive metabolic panel, Lipase, POCT CBC, omeprazole (PRILOSEC) 20 MG capsule, Ambulatory referral to Gastroenterology  - Concerning for possible gastritis versus peptic ulcer disease. CBC stable as above. Start omeprazole 20 mg daily, check CMP, lipase, referred to gastroenterology. ER/RTC precautions if worsening.  Nonintractable episodic headache, unspecified headache type Blurry vision, bilateral  -Nonfocal neuro exam. Advised follow-up as soon as possible with ophthalmologist to check pressure and evaluate his blurry vision. If symptoms persist or worsen, consider neuro eval or neuroimaging.  Urinary frequency - Plan: POCT glucose (manual entry), POCT urinalysis dipstick, POCT Microscopic Urinalysis ([facility]), PSA  -Borderline hyperglycemic, reassuring urinalysis. PSA. Recheck to discuss symptoms further within 1 week  Adjustment disorder with anxious mood - Plan: hydrOXYzine (ATARAX/VISTARIL) 10 MG  tablet  -Suspected generalized anxiety versus adjustment disorder with recent health issues. Handout given, due to side effects of benzodiazepine will try low-dose hydroxyzine 1/2-1 tablet at bedtime as needed. Recheck within 1 week  Meds ordered this encounter  Medications  . hydrOXYzine (ATARAX/VISTARIL) 10 MG tablet    Sig: Take 0.5-1 tablets (5-10 mg total) by mouth at bedtime as needed.    Dispense:  30 tablet    Refill:  0  . omeprazole (PRILOSEC) 20 MG capsule    Sig: Take 1 capsule (20 mg total) by mouth daily.    Dispense:  30 capsule    Refill:  3   Patient Instructions    Call your eye care provider for appointment as soon as possible to check your blurry vision and pressure in your head.  Blood sugar was only borderline elevated - does not appear to be cause of your symptoms. [facility] is an option if you need a new provider. [facility] is also in [place]. If any worsening headache or blurry version, or weakness, go to emergency room or call 911.   Vistaril 1/2-1 pill at bedtime as needed for anxiety or trouble getting to sleep. See information below on adjustment disorder. I would recommend counseling if the symptoms persist. Follow-up within 1 week to discuss those symptoms further.  Start omeprazole once per day for your abdominal pain, and I will refer you to a gastroenterologist.  Return to the clinic or go to the nearest emergency room if any of your symptoms worsen or new symptoms occur.   Abdominal Pain, Adult Abdominal pain can be caused by many things. Often, abdominal pain is not serious and it gets better with no treatment or by being treated at home. However, sometimes abdominal pain is serious. Your health care provider will do a medical history and a physical exam to try to determine the cause of your abdominal pain. Follow these instructions at home:  Take over-the-counter and prescription medicines only as told by your health care provider.  Do not take a laxative unless told by your health care provider.  Drink enough fluid  to keep your urine clear or pale yellow.  Watch your condition for any changes.  Keep all follow-up visits as told by your health care provider. This is important. Contact a health care provider if:  Your abdominal pain changes or gets worse.  You are not hungry or you lose weight without trying.  You are constipated or have diarrhea for more than 2-3 days.  You have pain when you urinate or have a bowel movement.  Your abdominal pain wakes you up at night.  Your pain gets worse with meals, after eating, or with certain foods.  You are throwing up and cannot keep anything down.  You have a fever. Get help right away if:  Your pain does not go away as soon as your health care provider told you to expect.  You cannot stop throwing up.  Your pain is only in areas of the abdomen, such as the right side or the left lower portion of the abdomen.  You have bloody or black stools, or stools that look like tar.  You have severe pain, cramping, or bloating in your abdomen.  You have signs of dehydration, such as: ? Dark urine, very little urine, or no urine. ? Cracked lips. ? Dry mouth. ? Sunken eyes. ? Sleepiness. ? Weakness. This information is not intended to replace advice given to you by your health care provider. Make sure you discuss any questions you have with your health care provider. Document Released: 05/20/2005 Document Revised: 02/28/2016 Document Reviewed: 01/22/2016 Elsevier Interactive Patient Education  2017 [place] Having blurred vision means that you cannot see things clearly. Your vision may seem fuzzy or out of focus. Blurred vision is a very common symptom of an eye or vision problem. Blurred vision is often a gradual blur that occurs in one eye or both eyes. There are many causes of blurred vision, including cataracts, macular degeneration, and  diabetic retinopathy. Blurred vision can be diagnosed based on your symptoms and a physical exam. Tell your health care provider about any other health problems you have, any recent eye injury, and any prior surgeries. You may need to see a health care provider who specializes in eye problems (ophthalmologist). Your treatment depends on what is causing your blurred vision.  HOME CARE INSTRUCTIONS  Tell your health care provider about any changes in your blurred vision.  Do not drive or operate heavy machinery if your vision is blurry.  Keep all follow-up visits as directed by your health care provider. This is important. SEEK MEDICAL CARE IF:  Your symptoms get worse.  You have new symptoms.  You have trouble seeing at night.  You have trouble seeing up close or far away.  You have trouble noticing the difference between colors. SEEK IMMEDIATE MEDICAL CARE IF:  You have severe eye pain.  You have a severe headache.  You have flashing lights in your field of vision.  You have a sudden change in vision.  You have a sudden loss of vision.  You have vision change after an injury.  You notice drainage coming from your eyes.  You notice a rash around your eyes. This information is not intended to replace advice given to you by your health care provider. Make sure you discuss any questions you have with your health care provider. Document Released: 08/13/2003 Document Revised: 12/25/2014 Document Reviewed: 07/04/2014 Elsevier Interactive Patient Education  2017 [place] Headache Without Cause A headache is pain or discomfort  felt around the head or neck area. The specific cause of a headache may not be found. There are many causes and types of headaches. A few common ones are:  Tension headaches.  Migraine headaches.  Cluster headaches.  Chronic daily headaches.  Follow these instructions at home: Watch your condition for any changes. Take these steps to  help with your condition: Managing pain  Take over-the-counter and prescription medicines only as told by your health care provider.  Lie down in a dark, quiet room when you have a headache.  If directed, apply ice to the head and neck area: ? Put ice in a plastic bag. ? Place a towel between your skin and the bag. ? Leave the ice on for 20 minutes, 2-3 times per day.  Use a heating pad or hot shower to apply heat to the head and neck area as told by your health care provider.  Keep lights dim if bright lights bother you or make your headaches worse. Eating and drinking  Eat meals on a regular schedule.  Limit alcohol use.  Decrease the amount of caffeine you drink, or stop drinking caffeine. General instructions  Keep all follow-up visits as told by your health care provider. This is important.  Keep a headache journal to help find out what may trigger your headaches. For example, write down: ? What you eat and drink. ? How much sleep you get. ? Any change to your diet or medicines.  Try massage or other relaxation techniques.  Limit stress.  Sit up straight, and do not tense your muscles.  Do not use tobacco products, including cigarettes, chewing tobacco, or e-cigarettes. If you need help quitting, ask your health care provider.  Exercise regularly as told by your health care provider.  Sleep on a regular schedule. Get 7-9 hours of sleep, or the amount recommended by your health care provider. Contact a health care provider if:  Your symptoms are not helped by medicine.  You have a headache that is different from the usual headache.  You have nausea or you vomit.  You have a fever. Get help right away if:  Your headache becomes severe.  You have repeated vomiting.  You have a stiff neck.  You have a loss of vision.  You have problems with speech.  You have pain in the eye or ear.  You have muscular weakness or loss of muscle control.  You lose  your balance or have trouble walking.  You feel faint or pass out.  You have confusion. This information is not intended to replace advice given to you by your health care provider. Make sure you discuss any questions you have with your health care provider. Document Released: 08/10/2005 Document Revised: 01/16/2016 Document Reviewed: 12/03/2014 Elsevier Interactive Patient Education  2017 [employer] Inc[employer]  Adjustment Disorder, Adult Adjustment disorder is a group of symptoms that can develop after a stressful life event, such as the loss of a job or serious physical illness. The symptoms can affect how you feel, think, and act. They may interfere with your relationships. Adjustment disorder increases your risk of suicide and substance abuse. If this disorder is not managed early, it can develop into a more serious condition, such as major depressive disorder or post-traumatic stress disorder. What are the causes? This condition happens when you have trouble recovering from or coping with a stressful life event. What increases the risk? You are more likely to develop this condition if:  You have had depression  or anxiety.  You are being treated for a long-term (chronic) illness.  You are being treated for an illness that cannot be cured (terminal illness).  You have a family history of mental illness.  What are the signs or symptoms? Symptoms of this condition include:  Extreme trouble doing daily tasks, such as going to work.  Sadness, depression, or crying spells.  Worrying a lot.  Loss of enjoyment.  Change in appetite or weight.  Feelings of loss or hopelessness.  Thoughts of suicide.  Anxiety, worry, or nervousness.  Trouble sleeping.  Avoiding family and friends.  Fighting or vandalism.  Complaining of feeling sick without being ill.  Feeling dazed or disconnected.  Nightmares.  Trouble sleeping.  Irritability.  Reckless driving.  Poor work  Systems analyst.  Ignoring bills.  Symptoms of this condition start within three months of the stressful event. They do not last more than six months, unless the stressful circumstances last longer. Normal grieving after the death of a loved one is not a symptom of this condition. How is this diagnosed? To diagnose this condition, your health care provider will ask about what has happened in your life and how it has affected you. He or she may also ask about your medical history and your use of medicines, alcohol, and other substances. Your health care provider may do a physical exam and order lab tests or other studies. You may be referred to a mental health specialist. How is this treated? Treatment options for this condition include:  Counseling or talk therapy. Talk therapy is usually provided by mental health specialists.  Medicines. Certain medicines may help with depression, anxiety, and sleep.  Support groups. These offer emotional support, advice, and guidance. They are made up of people who have had similar experiences.  Observation and time. This is sometimes called "watchful waiting." In this treatment, health care providers monitor your health and behavior without other treatment. Adjustment disorder sometimes gets better on its own with time.  Follow these instructions at home:  Take over-the-counter and prescription medicines only as told by your health care provider.  Keep all follow-up visits as told by your health care provider. This is important. Contact a health care provider if:  Your symptoms do not improve in six months.  Your symptoms get worse. Get help right away if:  You have serious thoughts about hurting yourself or someone else. If you ever feel like you may hurt yourself or others, or have thoughts about taking your own life, get help right away. You can go to your nearest emergency department or call:  Your local emergency services (911 in the [place]  A  suicide crisis helpline, such as the [facility] at 831-579-7850. This is open 24 hours a day.  Summary  Adjustment disorder is a group of symptoms that can develop after a stressful life event, such as the loss of a job or serious physical illness. The symptoms can affect how you feel, think, and act. They may interfere with your relationships.  Symptoms of this condition start within three months of the stressful event. They do not last more than six months, unless the stressful circumstances last longer.  Treatment may include talk therapy, medicines, participation in a support group, or observation to see if symptoms improve.  Contact your health care provider if your symptoms get worse or do not improve in six months.  If you ever feel like you may hurt yourself or others, or have thoughts about  taking your own life, get help right away. This information is not intended to replace advice given to you by your health care provider. Make sure you discuss any questions you have with your health care provider. Document Released: 04/14/2006 Document Revised: 10/09/2016 Document Reviewed: 10/09/2016 Elsevier Interactive Patient Education  2018 [employer].   IF you received an x-ray today, you will receive an invoice from [facility]. Please contact [facility] at [phone] with questions or concerns regarding your invoice.   IF you received labwork today, you will receive an invoice from [facility]. Please contact [facility] at [phone] with questions or concerns regarding your invoice.   Our billing staff will not be able to assist you with questions regarding bills from these companies.  You will be contacted with the lab results as soon as they are available. The fastest way to get your results is to activate your My Chart account. Instructions are located on the last page of this paperwork. If you have not heard from [place] regarding the  results in 2 weeks, please contact this office.      I personally performed the services described in this documentation, which was scribed in my presence. The recorded information has been reviewed and considered for accuracy and completeness, addended by me as needed, and agree with information above.  Signed,   [name], MD [facility].  02/07/17 5:17 PM

## 2017-02-07 LAB — COMPREHENSIVE METABOLIC PANEL
A/G RATIO: 1.7 (ref 1.2–2.2)
ALT: 14 IU/L (ref 0–44)
AST: 17 IU/L (ref 0–40)
Albumin: 4.8 g/dL (ref 3.6–4.8)
Alkaline Phosphatase: 97 IU/L (ref 39–117)
BILIRUBIN TOTAL: 0.2 mg/dL (ref 0.0–1.2)
BUN / CREAT RATIO: 14 (ref 10–24)
BUN: 13 mg/dL (ref 8–27)
CHLORIDE: 101 mmol/L (ref 96–106)
CO2: 23 mmol/L (ref 20–29)
Calcium: 9.9 mg/dL (ref 8.6–10.2)
Creatinine, Ser: 0.96 mg/dL (ref 0.76–1.27)
GFR calc Af Amer: 98 mL/min/{1.73_m2} (ref >59)
GFR calc non Af Amer: 84 mL/min/{1.73_m2} (ref >59)
Globulin, Total: 2.8 g/dL (ref 1.5–4.5)
Glucose: 111 mg/dL — ABNORMAL HIGH (ref 65–99)
POTASSIUM: 4.8 mmol/L (ref 3.5–5.2)
Sodium: 140 mmol/L (ref 134–144)
Total Protein: 7.6 g/dL (ref 6.0–8.5)

## 2017-02-07 LAB — PSA: Prostate Specific Ag, Serum: 2.9 ng/mL (ref 0.0–4.0)

## 2017-02-07 LAB — LIPASE: Lipase: 31 U/L (ref 13–78)

## 2017-02-12 ENCOUNTER — Encounter: Payer: Self-pay | Admitting: Internal Medicine

## 2017-02-12 ENCOUNTER — Ambulatory Visit: Payer: 59 | Admitting: Family Medicine

## 2017-03-26 ENCOUNTER — Encounter: Payer: Self-pay | Admitting: Internal Medicine

## 2017-03-26 ENCOUNTER — Ambulatory Visit (INDEPENDENT_AMBULATORY_CARE_PROVIDER_SITE_OTHER): Payer: 59 | Admitting: Internal Medicine

## 2017-03-26 ENCOUNTER — Encounter (INDEPENDENT_AMBULATORY_CARE_PROVIDER_SITE_OTHER): Payer: Self-pay

## 2017-03-26 VITALS — BP 128/80 | HR 76 | Ht 70.0 in | Wt 164.2 lb

## 2017-03-26 DIAGNOSIS — Z720 Tobacco use: Secondary | ICD-10-CM

## 2017-03-26 DIAGNOSIS — R1013 Epigastric pain: Secondary | ICD-10-CM

## 2017-03-26 DIAGNOSIS — Z1211 Encounter for screening for malignant neoplasm of colon: Secondary | ICD-10-CM

## 2017-03-26 MED ORDER — NA SULFATE-K SULFATE-MG SULF 17.5-3.13-1.6 GM/177ML PO SOLN: 1.0000 | Freq: Once | ORAL | 0 refills | Status: AC

## 2017-03-26 NOTE — Progress Notes (Signed)
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 [name] with chief complaint of epigastric pain. Patient reports a 3 month history of nagging epigastric discomfort. This can bother him at any time during the day. Less noticeable when he is working. More noticeable at night. Improved when he lies on his left side. Seemingly better with food. He denies any additional exacerbating or relieving factors. The pain is localized anteriorly. He was evaluated by his primary care provider 02/06/2017. Blood work at that time revealed unremarkable comprehensive metabolic panel and CBC as well as PSA. He was prescribed omeprazole 20 mg daily. He states that he took this for 2 or 3 days then stop because he did not feel that he has acid reflux symptoms. He continues to smoke. Rarely uses aspirin. No NSAIDs. No problems with nausea, vomiting, dysphagia, weight loss, melena, or change in bowel habits. He also inquires about screening colonoscopy. He is accompanied today by his wife.  REVIEW OF SYSTEMS:  All non-GI ROS negative except for  Past Medical History:  Diagnosis Date  . Allergy   . Anxiety     Past Surgical History:  Procedure Laterality Date  . KIDNEY STONE SURGERY      Social History Luke Franco  reports that he has been smoking Cigarettes.  He has a 25.00 pack-year smoking history. He has never used smokeless tobacco. He reports that he does not drink alcohol or use drugs.  family history includes Diabetes in his brother and mother; Heart disease in his mother.  Allergies  Allergen Reactions  . Amoxicillin     vomiting       PHYSICAL EXAMINATION: Vital signs: BP 128/80 (BP Location: Left Arm, Patient Position: Sitting, Cuff Size: Normal)   Pulse 76   Ht 5\' 10"  (1.778 m) Comment: height measured without shoes  Wt 164 lb 4 oz (74.5 kg)   BMI 23.57 kg/m    Constitutional: generally well-appearing, no acute distress. Smells of tobacco Psychiatric: alert and oriented x3, cooperative Eyes: extraocular movements intact, anicteric, conjunctiva pink Mouth: oral pharynx moist, no lesions Neck: supple Without thyromegaly Lymph: no lymphadenopathy Cardiovascular: heart regular rate and rhythm, no murmur Lungs: clear to auscultation bilaterally Abdomen: soft, nontender, nondistended, no obvious ascites, no peritoneal signs, normal bowel sounds, no organomegaly Rectal: Deferred until colonoscopy Extremities: no clubbing cyanosis or lower extremity edema bilaterally Skin: no lesions on visible extremities Neuro: No focal deficits. Normal DTRs. Cranial nerves intact  ASSESSMENT:  #1. Epigastric pain of 3 months duration. Rule out peptic ulcer. Rule out GERD equivalent. Does not sound like a biliary process #2. Colon cancer screening. Baseline risk #3. Tobacco abuse. Ongoing   PLAN:  #1. Resume omeprazole 20 mg daily #2. Avoid all aspirin and NSAIDs #3. Schedule upper endoscopy to evaluate persistent epigastric pain.The nature of the procedure, as well as the risks, benefits, and alternatives were carefully and thoroughly reviewed with the patient. Ample time for discussion and questions allowed. The patient understood, was satisfied, and agreed to proceed. #4. Stop smoking #5. Schedule screening colonoscopy.The nature of the procedure, as well as the risks, benefits, and alternatives were carefully and thoroughly reviewed with the patient. Ample time for discussion and questions allowed. The patient understood, was satisfied, and agreed to proceed.  A copy of this consultation note has been sent to Dr. Carlota Raspberry

## 2017-03-26 NOTE — Patient Instructions (Signed)

## 2017-05-21 ENCOUNTER — Encounter: Payer: Self-pay | Admitting: Internal Medicine

## 2017-05-27 ENCOUNTER — Telehealth: Payer: Self-pay | Admitting: Internal Medicine

## 2017-05-27 NOTE — Telephone Encounter (Signed)
Patient called to state that he accidentally ate popcorn and beans within the five days of the procedure. Patient was informed that it was ok to proceed with colonoscopy. Patient was instructed to drink a lot of fluids. Instructions for Suprep were reviewed with patient.   Riki Sheer, LPN

## 2017-05-28 ENCOUNTER — Encounter: Payer: Self-pay | Admitting: Internal Medicine

## 2017-05-28 ENCOUNTER — Ambulatory Visit (AMBULATORY_SURGERY_CENTER): Payer: 59 | Admitting: Internal Medicine

## 2017-05-28 VITALS — BP 117/69 | HR 73 | Temp 98.7°F | Resp 15 | Ht 70.0 in | Wt 164.0 lb

## 2017-05-28 DIAGNOSIS — D122 Benign neoplasm of ascending colon: Secondary | ICD-10-CM

## 2017-05-28 DIAGNOSIS — Z1211 Encounter for screening for malignant neoplasm of colon: Secondary | ICD-10-CM

## 2017-05-28 DIAGNOSIS — R1013 Epigastric pain: Secondary | ICD-10-CM

## 2017-05-28 MED ORDER — SODIUM CHLORIDE 0.9 % IV SOLN: 500.0000 mL | INTRAVENOUS | Status: DC

## 2017-05-28 NOTE — Op Note (Signed)
[name]: Luke Franco Procedure Date: 05/28/2017 3:13 PM MRN: [id number] Endoscopist: Docia Chuck. Henrene Pastor , MD Age: 61 Referring MD:  Date of Birth: Apr 28, 1956 Gender: Male Account #: [id number] Procedure:                Colonoscopy, with cold snare polypectomy x 1 Indications:              Screening for colorectal malignant neoplasm Medicines:                Monitored Anesthesia Care Procedure:                Pre-Anesthesia Assessment:                           - Prior to the procedure, a History and Physical                            was performed, and patient medications and                            allergies were reviewed. The patient's tolerance of                            previous anesthesia was also reviewed. The risks                            and benefits of the procedure and the sedation                            options and risks were discussed with the patient.                            All questions were answered, and informed consent                            was obtained. Prior Anticoagulants: The patient has                            taken no previous anticoagulant or antiplatelet                            agents. ASA Grade Assessment: II - A patient with                            mild systemic disease. After reviewing the risks                            and benefits, the patient was deemed in                            satisfactory condition to undergo the procedure.                           After obtaining informed consent, the colonoscope  was passed under direct vision. Throughout the                            procedure, the patient's blood pressure, pulse, and                            oxygen saturations were monitored continuously. The                            Colonoscope was introduced through the anus and                            advanced to the the cecum, identified by    appendiceal orifice and ileocecal valve. The                            ileocecal valve, appendiceal orifice, and rectum                            were photographed. The quality of the bowel                            preparation was excellent. The colonoscopy was                            performed without difficulty. The patient tolerated                            the procedure well. The bowel preparation used was                            SUPREP. Scope In: 3:23:22 PM Scope Out: 3:37:06 PM Scope Withdrawal Time: 0 hours 11 minutes 17 seconds  Total Procedure Duration: 0 hours 13 minutes 44 seconds  Findings:                 A 1 mm polyp was found in the ascending colon. The                            polyp was removed with a cold snare. Resection and                            retrieval were complete.                           The exam was otherwise without abnormality on                            direct and retroflexion views. Complications:            No immediate complications. Estimated blood loss:                            None. Estimated Blood Loss:     Estimated blood loss: none. Impression:               -  One 1 mm polyp in the ascending colon, removed                            with a cold snare. Resected and retrieved.                           - The examination was otherwise normal on direct                            and retroflexion views. Recommendation:           - Repeat colonoscopy in 5-10 years for surveillance.                           - Patient has a contact number available for                            emergencies. The signs and symptoms of potential                            delayed complications were discussed with the                            patient. Return to normal activities tomorrow.                            Written discharge instructions were provided to the                            patient.                           - Resume previous  diet.                           - Continue present medications.                           - Await pathology results. Docia Chuck. Henrene Pastor, MD 05/28/2017 3:39:12 PM This report has been signed electronically.

## 2017-05-28 NOTE — Op Note (Signed)
[facility] Patient Name: Luke Franco Procedure Date: 05/28/2017 3:14 PM MRN: [id number] Endoscopist: Docia Chuck. Henrene Pastor , MD Age: 61 Referring MD:  Date of Birth: 06-01-1956 Gender: Male Account #: [id number] Procedure:                Upper GI endoscopy Indications:              Epigastric abdominal pain. Problem has resolved                            since office visit 2 months ago. Completed 8 week                            course of PPI Medicines:                Monitored Anesthesia Care Procedure:                Pre-Anesthesia Assessment:                           - Prior to the procedure, a History and Physical                            was performed, and patient medications and                            allergies were reviewed. The patient's tolerance of                            previous anesthesia was also reviewed. The risks                            and benefits of the procedure and the sedation                            options and risks were discussed with the patient.                            All questions were answered, and informed consent                            was obtained. Prior Anticoagulants: The patient has                            taken no previous anticoagulant or antiplatelet                            agents. ASA Grade Assessment: II - A patient with                            mild systemic disease. After reviewing the risks                            and benefits, the patient was deemed in  satisfactory condition to undergo the procedure.                           After obtaining informed consent, the endoscope was                            passed under direct vision. Throughout the                            procedure, the patient's blood pressure, pulse, and                            oxygen saturations were monitored continuously. The                            Model GIF-[id number]) scope was  introduced                            through the mouth, and advanced to the second part                            of duodenum. The upper GI endoscopy was                            accomplished without difficulty. The patient                            tolerated the procedure well. Scope In: Scope Out: Findings:                 The esophagus was normal.                           The stomach was normal.                           The examined duodenum was normal.                           The cardia and gastric fundus were normal on                            retroflexion. Complications:            No immediate complications. Estimated Blood Loss:     Estimated blood loss: none. Impression:               - Normal esophagus.                           - Normal stomach.                           - Normal examined duodenum.                           - No specimens collected. Recommendation:           - Patient  has a contact number available for                            emergencies. The signs and symptoms of potential                            delayed complications were discussed with the                            patient. Return to normal activities tomorrow.                            Written discharge instructions were provided to the                            patient.                           - Resume previous diet.                           - Continue present medications. Docia Chuck. Henrene Pastor, MD 05/28/2017 3:45:46 PM This report has been signed electronically.

## 2017-05-28 NOTE — Progress Notes (Signed)
To recovery, report to RN, VSS. 

## 2017-05-28 NOTE — Patient Instructions (Signed)
Impression/Recommendations:  Polyp handout given to patient.  Repeat colonoscopy in 5-10 years for surveillance.  Resume previous diet. Continue present medications.  Await pathology results.  YOU HAD AN ENDOSCOPIC PROCEDURE TODAY AT [facility]:   Refer to the procedure report that was given to you for any specific questions about what was found during the examination.  If the procedure report does not answer your questions, please call your gastroenterologist to clarify.  If you requested that your care partner not be given the details of your procedure findings, then the procedure report has been included in a sealed envelope for you to review at your convenience later.  YOU SHOULD EXPECT: Some feelings of bloating in the abdomen. Passage of more gas than usual.  Walking can help get rid of the air that was put into your GI tract during the procedure and reduce the bloating. If you had a lower endoscopy (such as a colonoscopy or flexible sigmoidoscopy) you may notice spotting of blood in your stool or on the toilet paper. If you underwent a bowel prep for your procedure, you may not have a normal bowel movement for a few days.  Please Note:  You might notice some irritation and congestion in your nose or some drainage.  This is from the oxygen used during your procedure.  There is no need for concern and it should clear up in a day or so.  SYMPTOMS TO REPORT IMMEDIATELY:   Following lower endoscopy (colonoscopy or flexible sigmoidoscopy):  Excessive amounts of blood in the stool  Significant tenderness or worsening of abdominal pains  Swelling of the abdomen that is new, acute  Fever of 100F or higher   Following upper endoscopy (EGD)  Vomiting of blood or coffee ground material  New chest pain or pain under the shoulder blades  Painful or persistently difficult swallowing  New shortness of breath  Fever of 100F or higher  Black, tarry-looking stools  For  urgent or emergent issues, a gastroenterologist can be reached at any hour by calling [phone].   DIET:  We do recommend a small meal at first, but then you may proceed to your regular diet.  Drink plenty of fluids but you should avoid alcoholic beverages for 24 hours.  ACTIVITY:  You should plan to take it easy for the rest of today and you should NOT DRIVE or use heavy machinery until tomorrow (because of the sedation medicines used during the test).    FOLLOW UP: Our staff will call the number listed on your records the next business day following your procedure to check on you and address any questions or concerns that you may have regarding the information given to you following your procedure. If we do not reach you, we will leave a message.  However, if you are feeling well and you are not experiencing any problems, there is no need to return our call.  We will assume that you have returned to your regular daily activities without incident.  If any biopsies were taken you will be contacted by phone or by letter within the next 1-3 weeks.  Please call us at [phone] if you have not heard about the biopsies in 3 weeks.    SIGNATURES/CONFIDENTIALITY: You and/or your care partner have signed paperwork which will be entered into your electronic medical record.  These signatures attest to the fact that that the information above on your After Visit Summary has been reviewed and is understood.  Full responsibility of the confidentiality of this discharge information lies with you and/or your care-partner.

## 2017-05-28 NOTE — Progress Notes (Signed)
Called to room to assist during endoscopic procedure.  Patient ID and intended procedure confirmed with present staff. Received instructions for my participation in the procedure from the performing physician.  

## 2017-05-31 ENCOUNTER — Telehealth: Payer: Self-pay | Admitting: *Deleted

## 2017-05-31 NOTE — Telephone Encounter (Signed)
  Follow up Call-  Call back number 05/28/2017  Post procedure Call Back phone  # [phone]  Permission to leave phone message Yes  Some recent data might be hidden     Patient questions:  Do you have a fever, pain , or abdominal swelling? No. Pain Score  0 *  Have you tolerated food without any problems? Yes.    Have you been able to return to your normal activities? Yes.    Do you have any questions about your discharge instructions: Diet   No. Medications  No. Follow up visit  No.  Do you have questions or concerns about your Care? No.  Actions: * If pain score is 4 or above: No action needed, pain <4.spoke Pam,pt's wife-pt is doing fine and has gone to work this morning

## 2017-06-03 ENCOUNTER — Encounter: Payer: Self-pay | Admitting: Internal Medicine

## 2018-02-02 ENCOUNTER — Other Ambulatory Visit: Payer: Self-pay | Admitting: Family Medicine

## 2018-02-02 DIAGNOSIS — F4322 Adjustment disorder with anxiety: Secondary | ICD-10-CM

## 2018-02-02 NOTE — Telephone Encounter (Signed)
hydroxyzine refill Last Refill:02/06/17 # 30 No RF Last OV: 02/06/17 PCP: Dr Carlota Raspberry Pharmacy:[facility] [address]

## 2018-02-22 ENCOUNTER — Ambulatory Visit: Payer: Worker's Compensation

## 2018-02-22 ENCOUNTER — Other Ambulatory Visit: Payer: Self-pay

## 2018-02-22 ENCOUNTER — Encounter: Payer: Self-pay | Admitting: Family Medicine

## 2018-02-22 ENCOUNTER — Ambulatory Visit (INDEPENDENT_AMBULATORY_CARE_PROVIDER_SITE_OTHER): Payer: Worker's Compensation | Admitting: Family Medicine

## 2018-02-22 VITALS — BP 150/80 | HR 76 | Temp 98.0°F | Resp 16 | Ht 68.0 in | Wt 160.2 lb

## 2018-02-22 DIAGNOSIS — S39012A Strain of muscle, fascia and tendon of lower back, initial encounter: Secondary | ICD-10-CM

## 2018-02-22 DIAGNOSIS — M5442 Lumbago with sciatica, left side: Secondary | ICD-10-CM

## 2018-02-22 DIAGNOSIS — R109 Unspecified abdominal pain: Secondary | ICD-10-CM

## 2018-02-22 MED ORDER — CYCLOBENZAPRINE HCL 5 MG PO TABS: ORAL_TABLET | ORAL | 0 refills | Status: DC

## 2018-02-22 NOTE — Patient Instructions (Addendum)
Based on your exam today, I suspect you have a low back strain, and possible abdominal wall muscle strain.  However as we discussed a herniated disc is also possible.  Reflexes look okay here today, so can treat initially with muscle relaxant, over-the-counter ibuprofen up to 600 mg every 6 hours with food.  Stop that medication if it causes any stomach upset.  I would recommend against any lifting, twisting, or turning right now until you are seen for follow-up.  Okay to follow-up as planned with your [employer] provider tomorrow, or we are happy to see you back here for follow-up if needed.  Either way I would recommend follow-up within the next 4 to 5 days to make sure your symptoms are continuing to improve.  As we discussed if you have any loss of control of urine or stool, numbness in the groin, or weakness in the legs, I would recommend evaluation emergently through the emergency room.  Return to the clinic or go to the nearest emergency room if any of your symptoms worsen or new symptoms occur.   Back Pain, Adult Many adults have back pain from time to time. Common causes of back pain include:  A strained muscle or ligament.  Wear and tear (degeneration) of the spinal disks.  Arthritis.  A hit to the back.  Back pain can be short-lived (acute) or last a long time (chronic). A physical exam, lab tests, and imaging studies may be done to find the cause of your pain. Follow these instructions at home: Managing pain and stiffness  Take over-the-counter and prescription medicines only as told by your health care provider.  If directed, apply heat to the affected area as often as told by your health care provider. Use the heat source that your health care provider recommends, such as a moist heat pack or a heating pad. ? Place a towel between your skin and the heat source. ? Leave the heat on for 20-30 minutes. ? Remove the heat if your skin turns bright red. This is especially  important if you are unable to feel pain, heat, or cold. You have a greater risk of getting burned.  If directed, apply ice to the injured area: ? Put ice in a plastic bag. ? Place a towel between your skin and the bag. ? Leave the ice on for 20 minutes, 2-3 times a day for the first 2-3 days. Activity  Do not stay in bed. Resting more than 1-2 days can delay your recovery.  Take short walks on even surfaces as soon as you are able. Try to increase the length of time you walk each day.  Do not sit, drive, or stand in one place for more than 30 minutes at a time. Sitting or standing for long periods of time can put stress on your back.  Use proper lifting techniques. When you bend and lift, use positions that put less stress on your back: ? [place] your knees. ? Keep the load close to your body. ? Avoid twisting.  Exercise regularly as told by your health care provider. Exercising will help your back heal faster. This also helps prevent back injuries by keeping muscles strong and flexible.  Your health care provider may recommend that you see a physical therapist. This person can help you come up with a safe exercise program. Do any exercises as told by your physical therapist. Lifestyle  Maintain a healthy weight. Extra weight puts stress on your back and makes it  difficult to have good posture.  Avoid activities or situations that make you feel anxious or stressed. Learn ways to manage anxiety and stress. One way to manage stress is through exercise. Stress and anxiety increase muscle tension and can make back pain worse. General instructions  Sleep on a firm mattress in a comfortable position. Try lying on your side with your knees slightly bent. If you lie on your back, put a pillow under your knees.  Follow your treatment plan as told by your health care provider. This may include: ? Cognitive or behavioral therapy. ? Acupuncture or massage therapy. ? Meditation or yoga. Contact a  health care provider if:  You have pain that is not relieved with rest or medicine.  You have increasing pain going down into your legs or buttocks.  Your pain does not improve in 2 weeks.  You have pain at night.  You lose weight.  You have a fever or chills. Get help right away if:  You develop new bowel or bladder control problems.  You have unusual weakness or numbness in your arms or legs.  You develop nausea or vomiting.  You develop abdominal pain.  You feel faint. Summary  Many adults have back pain from time to time. A physical exam, lab tests, and imaging studies may be done to find the cause of your pain.  Use proper lifting techniques. When you bend and lift, use positions that put less stress on your back.  Take over-the-counter and prescription medicines and apply heat or ice as directed by your health care provider. This information is not intended to replace advice given to you by your health care provider. Make sure you discuss any questions you have with your health care provider. Document Released: 08/10/2005 Document Revised: 09/14/2016 Document Reviewed: 09/14/2016 Elsevier Interactive Patient Education  2018 [place] Strain A strain is a stretch or tear in a muscle or the strong cords of tissue that attach muscle to bone (tendons). Strains of the lower back (lumbar spine) are a common cause of low back pain. A strain occurs when muscles or tendons are torn or are stretched beyond their limits. The muscles may become inflamed, resulting in pain and sudden muscle tightening (spasms). A strain can happen suddenly due to an injury (trauma), or it can develop gradually due to overuse. There are three types of strains:  Grade 1 is a mild strain involving a minor tear of the muscle fibers or tendons. This may cause some pain but no loss of muscle strength.  Grade 2 is a moderate strain involving a partial tear of the muscle fibers or tendons.  This causes more severe pain and some loss of muscle strength.  Grade 3 is a severe strain involving a complete tear of the muscle or tendon. This causes severe pain and complete or nearly complete loss of muscle strength.  What are the causes? This condition may be caused by:  Trauma, such as a fall or a hit to the body.  Twisting or overstretching the back. This may result from doing activities that require a lot of energy, such as lifting heavy objects.  What increases the risk? The following factors may increase your risk of getting this condition:  Playing contact sports.  Participating in sports or activities that put excessive stress on the back and require a lot of bending and twisting, including: ? Lifting weights or heavy objects. ? Gymnastics. ? Soccer. ? Figure skating. ? Snowboarding.  Being overweight or obese.  Having poor strength and flexibility.  What are the signs or symptoms? Symptoms of this condition may include:  Sharp or dull pain in the lower back that does not go away. Pain may extend to the buttocks.  Stiffness.  Limited range of motion.  Inability to stand up straight due to stiffness or pain.  Muscle spasms.  How is this diagnosed? This condition may be diagnosed based on:  Your symptoms.  Your medical history.  A physical exam. ? Your health care provider may push on certain areas of your back to determine the source of your pain. ? You may be asked to bend forward, backward, and side to side to assess the severity of your pain and your range of motion.  Imaging tests, such as: ? X-rays. ? MRI.  How is this treated? Treatment for this condition may include:  Applying heat and cold to the affected area.  Medicines to help relieve pain and to relax your muscles (muscle relaxants).  NSAIDs to help reduce swelling and discomfort.  Physical therapy.  When your symptoms improve, it is important to gradually return to your  normal routine as soon as possible to reduce pain, avoid stiffness, and avoid loss of muscle strength. Generally, symptoms should improve within 6 weeks of treatment. However, recovery time varies. Follow these instructions at home: Managing pain, stiffness, and swelling  If directed, apply ice to the injured area during the first 24 hours after your injury. ? Put ice in a plastic bag. ? Place a towel between your skin and the bag. ? Leave the ice on for 20 minutes, 2-3 times a day.  If directed, apply heat to the affected area as often as told by your health care provider. Use the heat source that your health care provider recommends, such as a moist heat pack or a heating pad. ? Place a towel between your skin and the heat source. ? Leave the heat on for 20-30 minutes. ? Remove the heat if your skin turns bright red. This is especially important if you are unable to feel pain, heat, or cold. You may have a greater risk of getting burned. Activity  Rest and return to your normal activities as told by your health care provider. Ask your health care provider what activities are safe for you.  Avoid activities that take a lot of effort (are strenuous) for as long as told by your health care provider.  Do exercises as told by your health care provider. General instructions   Take over-the-counter and prescription medicines only as told by your health care provider.  If you have questions or concerns about safety while taking pain medicine, talk with your health care provider.  Do not drive or operate heavy machinery until you know how your pain medicine affects you.  Do not use any tobacco products, such as cigarettes, chewing tobacco, and e-cigarettes. Tobacco can delay bone healing. If you need help quitting, ask your health care provider.  Keep all follow-up visits as told by your health care provider. This is important. How is this prevented?  Warm up and stretch before being  active.  Cool down and stretch after being active.  Give your body time to rest between periods of activity.  Avoid: ? Being physically inactive for long periods at a time. ? Exercising or playing sports when you are tired or in pain.  Use correct form when playing sports and lifting heavy objects.  Use good  posture when sitting and standing.  Maintain a healthy weight.  Sleep on a mattress with medium firmness to support your back.  Make sure to use equipment that fits you, including shoes that fit well.  Be safe and responsible while being active to avoid falls.  Do at least 150 minutes of moderate-intensity exercise each week, such as brisk walking or water aerobics. Try a form of exercise that takes stress off your back, such as swimming or stationary cycling.  Maintain physical fitness, including: ? Strength. ? Flexibility. ? Cardiovascular fitness. ? Endurance. Contact a health care provider if:  Your back pain does not improve after 6 weeks of treatment.  Your symptoms get worse. Get help right away if:  Your back pain is severe.  You are unable to stand or walk.  You develop pain in your legs.  You develop weakness in your buttocks or legs.  You have difficulty controlling when you urinate or when you have a bowel movement. This information is not intended to replace advice given to you by your health care provider. Make sure you discuss any questions you have with your health care provider. Document Released: 08/10/2005 Document Revised: 04/16/2016 Document Reviewed: 05/22/2015 Elsevier Interactive Patient Education  2017 [employer].    IF you received an x-ray today, you will receive an invoice from [facility]. Please contact [facility] at [phone] with questions or concerns regarding your invoice.   IF you received labwork today, you will receive an invoice from [facility]. Please contact [facility] at [phone] with questions  or concerns regarding your invoice.   Our billing staff will not be able to assist you with questions regarding bills from these companies.  You will be contacted with the lab results as soon as they are available. The fastest way to get your results is to activate your My Chart account. Instructions are located on the last page of this paperwork. If you have not heard from [place] regarding the results in 2 weeks, please contact this office.

## 2018-02-22 NOTE — Progress Notes (Signed)
Subjective:    Patient ID: Luke Franco, male    DOB: December 13, 1955, 62 y.o.   MRN: [id number]  HPI Luke Franco is a 62 y.o. male Presents today for: Chief Complaint  Patient presents with  . Back Pain    per pt "tripped and twisted back". climbing off ladder at about 8 ft. caught left leg and twisted back, pt stated " I heard something pop". feeling numb on the side of left thigh.   Here for evaluation and second opinion after fall at work yesterday.  Was wearing safety harness, loop on lariat caught left leg, back twisted to the left and he heard something pop, fell down one step only.   He was evaluated yesterday by [facility], Dr. Ouida Sills.  No xray was done. No meds prescribed.  Per physician work activity status report was diagnosed with strain of muscle, fascia and tendon of lower back. He was placed on modified activity/restricted activity status of sitting 50% of the time, lifting up to 20 pounds, no squatting, no kneeling, standing walk up to 6 hours/day.  Planned follow-up visit on Wednesday, July 3 at 2:30 PM. Plan for possible PT tomorrow with needles or twisting or massage.  He was uncomfortable with that plan and wanted to get a second opinion.    He states that the pain is in the middle of his low back with radiation to his left leg/thigh, buttocks and groin. Left thigh feels numb at times and pain into groin, but denies actual weakness in the leg or difficulty walking. No bowel or bladder incontinence, no saddle anesthesia, no leg weakness (able to walk unassisted). Only a few hours of sleep d/t pain in back and leg.   No surgery to back in past, has had back muscle strains in past, but never had this type of pain. Took ibuprofen ?500mg  once yesterday. No meds today. Hurts to sneeze. Sore in muscles of lower abdomen as well.   No prior issues with pain medications. Denies alcohol, but smokes cigarettes.   Pain is minimally better today, not worse.   Patient  Active Problem List   Diagnosis Date Noted  . Tobacco abuse 06/30/2013   Past Medical History:  Diagnosis Date  . Allergy   . Anxiety    Past Surgical History:  Procedure Laterality Date  . KIDNEY STONE SURGERY     Allergies  Allergen Reactions  . Amoxicillin     vomiting   Prior to Admission medications   Medication Sig Start Date End Date Taking? Authorizing Provider  ALPRAZolam (NIRAVAM) 0.5 MG dissolvable tablet Take 1 tablet (0.5 mg total) by mouth 2 (two) times daily as needed for anxiety. 12/04/15  Yes Robyn Haber, MD  cetirizine (ZYRTEC) 10 MG tablet Take 10 mg by mouth daily.   Yes [provider]  hydrOXYzine (ATARAX/VISTARIL) 10 MG tablet Take 0.5-1 tablets (5-10 mg total) by mouth at bedtime as needed. Patient not taking: Reported on 03/26/2017 02/06/17   Wendie Agreste, MD  omeprazole (PRILOSEC) 20 MG capsule Take 1 capsule (20 mg total) by mouth daily. Patient not taking: Reported on 03/26/2017 02/06/17   Wendie Agreste, MD   Social History   Socioeconomic History  . Marital status: Married    Spouse name: Not on file  . Number of children: 1  . Years of education: Not on file  . Highest education level: Not on file  Occupational History  . Occupation: pipe fitter  Social Needs  .  Financial resource strain: Not on file  . Food insecurity:    Worry: Not on file    Inability: Not on file  . Transportation needs:    Medical: Not on file    Non-medical: Not on file  Tobacco Use  . Smoking status: Current Every Day Smoker    Packs/day: 1.00    Years: 25.00    Pack years: 25.00    Types: Cigarettes  . Smokeless tobacco: Never Used  Substance and Sexual Activity  . Alcohol use: No    Alcohol/week: 0.0 oz  . Drug use: No  . Sexual activity: Not on file  Lifestyle  . Physical activity:    Days per week: Not on file    Minutes per session: Not on file  . Stress: Not on file  Relationships  . Social connections:    Talks on phone: Not on  file    Gets together: Not on file    Attends religious service: Not on file    Active member of club or organization: Not on file    Attends meetings of clubs or organizations: Not on file    Relationship status: Not on file  . Intimate partner violence:    Fear of current or ex partner: Not on file    Emotionally abused: Not on file    Physically abused: Not on file    Forced sexual activity: Not on file  Other Topics Concern  . Not on file  Social History Narrative  . Not on file    Review of Systems  Respiratory: Negative for chest tightness and shortness of breath.   Gastrointestinal: Negative for blood in stool and vomiting.  Genitourinary: Negative for difficulty urinating and hematuria.  Musculoskeletal: Positive for back pain. Negative for joint swelling.   As in HPI.     Objective:   Physical Exam  Constitutional: He appears well-developed and well-nourished.  HENT:  Head: Normocephalic and atraumatic.  Neck: Normal range of motion.  Pulmonary/Chest: Effort normal.  Abdominal: Soft. There is no tenderness. Hernia confirmed negative in the right inguinal area and confirmed negative in the left inguinal area.  ttp lower abdominal wall musculature only, no other abdominal pain. No rebound/guarding.   Musculoskeletal:       Lumbar back: He exhibits decreased range of motion (guarded.), tenderness and spasm. He exhibits no bony tenderness (midline lower back diffusely with paraspinal pain and spasm. ).  Neurological: He is alert. He has normal strength.  Reflex Scores:      Patellar reflexes are 2+ on the right side and 2+ on the left side.      Achilles reflexes are 2+ on the right side and 2+ on the left side. Guarded with heel and toe walk due to pain, denies weakness.   Skin: Skin is warm and dry.  Psychiatric: He has a normal mood and affect. His behavior is normal.  appears uncomfortable.  Pain in low back only with seated leg raise, without leg radiation.  Dg  Lumbar Spine Complete  Result Date: 02/22/2018 CLINICAL DATA:  Low back pain into LEFT leg after fall with twisting yesterday EXAM: LUMBAR SPINE - COMPLETE 4+ VIEW COMPARISON:  None FINDINGS: 5 non-rib-bearing lumbar vertebra. Mild osseous demineralization. Vertebral body and disc space heights maintained. Minimal dextroconvex scoliosis. No acute fracture, bone destruction, or spondylolysis. Minimal retrolisthesis at L5-S1. Endplate spur formation at L1-L2 and L3-L4. Atherosclerotic calcifications aorta. SI joints preserved. IMPRESSION: Degenerative disc disease changes without acute abnormality. Aortic  Atherosclerosis (ICD10-I70.0). Electronically Signed   By: Lavonia Dana M.D.   On: 02/22/2018 19:12      Assessment & Plan:    JENNIE BOLAR is a 62 y.o. male Acute left-sided low back pain with left-sided sciatica - Plan: DG Lumbar Spine Complete, cyclobenzaprine (FLEXERIL) 5 MG tablet  Strain of lumbar region, initial encounter - Plan: cyclobenzaprine (FLEXERIL) 5 MG tablet  Abdominal wall pain  Strain of low back at work as above and some lower abdominal wall pain with possible abd wall strain from twisting injury. Imaging reviewed with degenerative changes, but no fracture. Based on description of pain, including radicular symptoms, may have herniated disc. No weakness and reflexes intact.   - rx for flexeril Q8 hours prn with side effects discussed. Also discussed stronger pain med if needed but deferred at present.  - has follow up planned in 1 day with provider through worker's comp. Can discuss symptoms further at that visit, and plan. Recommended against lifting given current exam. Note provided. Can follow up with our office if needed. RTC/ER precautions discussed.   Meds ordered this encounter  Medications  . cyclobenzaprine (FLEXERIL) 5 MG tablet    Sig: 1 pill by mouth up to every 8 hours as needed. Start with one pill by mouth each bedtime as needed due to sedation    Dispense:   15 tablet    Refill:  0   Patient Instructions    Based on your exam today, I suspect you have a low back strain, and possible abdominal wall muscle strain.  However as we discussed a herniated disc is also possible.  Reflexes look okay here today, so can treat initially with muscle relaxant, over-the-counter ibuprofen up to 600 mg every 6 hours with food.  Stop that medication if it causes any stomach upset.  I would recommend against any lifting, twisting, or turning right now until you are seen for follow-up.  Okay to follow-up as planned with your Worker's Comp provider tomorrow, or we are happy to see you back here for follow-up if needed.  Either way I would recommend follow-up within the next 4 to 5 days to make sure your symptoms are continuing to improve.  As we discussed if you have any loss of control of urine or stool, numbness in the groin, or weakness in the legs, I would recommend evaluation emergently through the emergency room.  Return to the clinic or go to the nearest emergency room if any of your symptoms worsen or new symptoms occur.   Back Pain, Adult Many adults have back pain from time to time. Common causes of back pain include:  A strained muscle or ligament.  Wear and tear (degeneration) of the spinal disks.  Arthritis.  A hit to the back.  Back pain can be short-lived (acute) or last a long time (chronic). A physical exam, lab tests, and imaging studies may be done to find the cause of your pain. Follow these instructions at home: Managing pain and stiffness  Take over-the-counter and prescription medicines only as told by your health care provider.  If directed, apply heat to the affected area as often as told by your health care provider. Use the heat source that your health care provider recommends, such as a moist heat pack or a heating pad. ? Place a towel between your skin and the heat source. ? Leave the heat on for 20-30 minutes. ? Remove the heat  if your skin turns bright red.  This is especially important if you are unable to feel pain, heat, or cold. You have a greater risk of getting burned.  If directed, apply ice to the injured area: ? Put ice in a plastic bag. ? Place a towel between your skin and the bag. ? Leave the ice on for 20 minutes, 2-3 times a day for the first 2-3 days. Activity  Do not stay in bed. Resting more than 1-2 days can delay your recovery.  Take short walks on even surfaces as soon as you are able. Try to increase the length of time you walk each day.  Do not sit, drive, or stand in one place for more than 30 minutes at a time. Sitting or standing for long periods of time can put stress on your back.  Use proper lifting techniques. When you bend and lift, use positions that put less stress on your back: ? [place] your knees. ? Keep the load close to your body. ? Avoid twisting.  Exercise regularly as told by your health care provider. Exercising will help your back heal faster. This also helps prevent back injuries by keeping muscles strong and flexible.  Your health care provider may recommend that you see a physical therapist. This person can help you come up with a safe exercise program. Do any exercises as told by your physical therapist. Lifestyle  Maintain a healthy weight. Extra weight puts stress on your back and makes it difficult to have good posture.  Avoid activities or situations that make you feel anxious or stressed. Learn ways to manage anxiety and stress. One way to manage stress is through exercise. Stress and anxiety increase muscle tension and can make back pain worse. General instructions  Sleep on a firm mattress in a comfortable position. Try lying on your side with your knees slightly bent. If you lie on your back, put a pillow under your knees.  Follow your treatment plan as told by your health care provider. This may include: ? Cognitive or behavioral therapy. ? Acupuncture or  massage therapy. ? Meditation or yoga. Contact a health care provider if:  You have pain that is not relieved with rest or medicine.  You have increasing pain going down into your legs or buttocks.  Your pain does not improve in 2 weeks.  You have pain at night.  You lose weight.  You have a fever or chills. Get help right away if:  You develop new bowel or bladder control problems.  You have unusual weakness or numbness in your arms or legs.  You develop nausea or vomiting.  You develop abdominal pain.  You feel faint. Summary  Many adults have back pain from time to time. A physical exam, lab tests, and imaging studies may be done to find the cause of your pain.  Use proper lifting techniques. When you bend and lift, use positions that put less stress on your back.  Take over-the-counter and prescription medicines and apply heat or ice as directed by your health care provider. This information is not intended to replace advice given to you by your health care provider. Make sure you discuss any questions you have with your health care provider. Document Released: 08/10/2005 Document Revised: 09/14/2016 Document Reviewed: 09/14/2016 Elsevier Interactive Patient Education  2018 [place] Strain A strain is a stretch or tear in a muscle or the strong cords of tissue that attach muscle to bone (tendons). Strains of the lower back (lumbar spine)  are a common cause of low back pain. A strain occurs when muscles or tendons are torn or are stretched beyond their limits. The muscles may become inflamed, resulting in pain and sudden muscle tightening (spasms). A strain can happen suddenly due to an injury (trauma), or it can develop gradually due to overuse. There are three types of strains:  Grade 1 is a mild strain involving a minor tear of the muscle fibers or tendons. This may cause some pain but no loss of muscle strength.  Grade 2 is a moderate strain involving  a partial tear of the muscle fibers or tendons. This causes more severe pain and some loss of muscle strength.  Grade 3 is a severe strain involving a complete tear of the muscle or tendon. This causes severe pain and complete or nearly complete loss of muscle strength.  What are the causes? This condition may be caused by:  Trauma, such as a fall or a hit to the body.  Twisting or overstretching the back. This may result from doing activities that require a lot of energy, such as lifting heavy objects.  What increases the risk? The following factors may increase your risk of getting this condition:  Playing contact sports.  Participating in sports or activities that put excessive stress on the back and require a lot of bending and twisting, including: ? Lifting weights or heavy objects. ? Gymnastics. ? Soccer. ? Figure skating. ? Snowboarding.  Being overweight or obese.  Having poor strength and flexibility.  What are the signs or symptoms? Symptoms of this condition may include:  Sharp or dull pain in the lower back that does not go away. Pain may extend to the buttocks.  Stiffness.  Limited range of motion.  Inability to stand up straight due to stiffness or pain.  Muscle spasms.  How is this diagnosed? This condition may be diagnosed based on:  Your symptoms.  Your medical history.  A physical exam. ? Your health care provider may push on certain areas of your back to determine the source of your pain. ? You may be asked to bend forward, backward, and side to side to assess the severity of your pain and your range of motion.  Imaging tests, such as: ? X-rays. ? MRI.  How is this treated? Treatment for this condition may include:  Applying heat and cold to the affected area.  Medicines to help relieve pain and to relax your muscles (muscle relaxants).  NSAIDs to help reduce swelling and discomfort.  Physical therapy.  When your symptoms improve,  it is important to gradually return to your normal routine as soon as possible to reduce pain, avoid stiffness, and avoid loss of muscle strength. Generally, symptoms should improve within 6 weeks of treatment. However, recovery time varies. Follow these instructions at home: Managing pain, stiffness, and swelling  If directed, apply ice to the injured area during the first 24 hours after your injury. ? Put ice in a plastic bag. ? Place a towel between your skin and the bag. ? Leave the ice on for 20 minutes, 2-3 times a day.  If directed, apply heat to the affected area as often as told by your health care provider. Use the heat source that your health care provider recommends, such as a moist heat pack or a heating pad. ? Place a towel between your skin and the heat source. ? Leave the heat on for 20-30 minutes. ? Remove the heat if your skin turns  bright red. This is especially important if you are unable to feel pain, heat, or cold. You may have a greater risk of getting burned. Activity  Rest and return to your normal activities as told by your health care provider. Ask your health care provider what activities are safe for you.  Avoid activities that take a lot of effort (are strenuous) for as long as told by your health care provider.  Do exercises as told by your health care provider. General instructions   Take over-the-counter and prescription medicines only as told by your health care provider.  If you have questions or concerns about safety while taking pain medicine, talk with your health care provider.  Do not drive or operate heavy machinery until you know how your pain medicine affects you.  Do not use any tobacco products, such as cigarettes, chewing tobacco, and e-cigarettes. Tobacco can delay bone healing. If you need help quitting, ask your health care provider.  Keep all follow-up visits as told by your health care provider. This is important. How is this  prevented?  Warm up and stretch before being active.  Cool down and stretch after being active.  Give your body time to rest between periods of activity.  Avoid: ? Being physically inactive for long periods at a time. ? Exercising or playing sports when you are tired or in pain.  Use correct form when playing sports and lifting heavy objects.  Use good posture when sitting and standing.  Maintain a healthy weight.  Sleep on a mattress with medium firmness to support your back.  Make sure to use equipment that fits you, including shoes that fit well.  Be safe and responsible while being active to avoid falls.  Do at least 150 minutes of moderate-intensity exercise each week, such as brisk walking or water aerobics. Try a form of exercise that takes stress off your back, such as swimming or stationary cycling.  Maintain physical fitness, including: ? Strength. ? Flexibility. ? Cardiovascular fitness. ? Endurance. Contact a health care provider if:  Your back pain does not improve after 6 weeks of treatment.  Your symptoms get worse. Get help right away if:  Your back pain is severe.  You are unable to stand or walk.  You develop pain in your legs.  You develop weakness in your buttocks or legs.  You have difficulty controlling when you urinate or when you have a bowel movement. This information is not intended to replace advice given to you by your health care provider. Make sure you discuss any questions you have with your health care provider. Document Released: 08/10/2005 Document Revised: 04/16/2016 Document Reviewed: 05/22/2015 Elsevier Interactive Patient Education  2017 [employer].    IF you received an x-ray today, you will receive an invoice from [facility]. Please contact [facility] at [phone] with questions or concerns regarding your invoice.   IF you received labwork today, you will receive an invoice from [facility]. Please  contact [facility] at [phone] with questions or concerns regarding your invoice.   Our billing staff will not be able to assist you with questions regarding bills from these companies.  You will be contacted with the lab results as soon as they are available. The fastest way to get your results is to activate your My Chart account. Instructions are located on the last page of this paperwork. If you have not heard from [place] regarding the results in 2 weeks, please contact this office.  Signed,   [name], MD [facility].  02/25/18 10:01 AM

## 2018-02-25 ENCOUNTER — Encounter: Payer: Self-pay | Admitting: Family Medicine

## 2018-03-01 ENCOUNTER — Ambulatory Visit: Payer: Worker's Compensation | Admitting: Family Medicine

## 2018-03-01 ENCOUNTER — Other Ambulatory Visit: Payer: Self-pay

## 2018-03-01 ENCOUNTER — Encounter: Payer: Self-pay | Admitting: Family Medicine

## 2018-03-01 VITALS — BP 142/76 | HR 95 | Temp 98.3°F | Ht 70.0 in | Wt 158.4 lb

## 2018-03-01 DIAGNOSIS — M5442 Lumbago with sciatica, left side: Secondary | ICD-10-CM | POA: Diagnosis not present

## 2018-03-01 DIAGNOSIS — S39012A Strain of muscle, fascia and tendon of lower back, initial encounter: Secondary | ICD-10-CM | POA: Diagnosis not present

## 2018-03-01 DIAGNOSIS — S39012D Strain of muscle, fascia and tendon of lower back, subsequent encounter: Secondary | ICD-10-CM

## 2018-03-01 DIAGNOSIS — Z131 Encounter for screening for diabetes mellitus: Secondary | ICD-10-CM

## 2018-03-01 LAB — GLUCOSE, POCT (MANUAL RESULT ENTRY): POC Glucose: 114 mg/dl — AB (ref 70–99)

## 2018-03-01 MED ORDER — PREDNISONE 20 MG PO TABS: ORAL_TABLET | ORAL | 0 refills | Status: DC

## 2018-03-01 MED ORDER — CYCLOBENZAPRINE HCL 5 MG PO TABS: 2.5000 mg | ORAL_TABLET | Freq: Three times a day (TID) | ORAL | 0 refills | Status: DC | PRN

## 2018-03-01 NOTE — Progress Notes (Signed)
Subjective:  By signing my name below, I, Essence Howell, attest that this documentation has been prepared under the direction and in the presence of Wendie Agreste, MD Electronically Signed: Ladene Artist, ED Scribe 03/01/2018 at 11:48 AM.   Patient ID: Luke Franco, male    DOB: 11-Nov-1955, 62 y.o.   MRN: [id number]  Chief Complaint  Patient presents with  . center lower back pain    it is a bit better but still very painful from last week   HPI Luke Franco is a 62 y.o. male who presents to Primary Care at [facility] complaining of low back pain. Seen 7/2 after fall that occurred at work 1 day prior when safety lariat got caught with his L leg. He fell with twisting of his back to the L. experienced a pop to the back. Had been eval by [facility], diagnosed with lumbar strain. When seen on 7/2, he was in significant discomfort with radiating pain to L thigh, buttocks and groin, episodic numb feeling with pain into groin but no cauda equina symptoms. Also had some symptoms of abdominal wall strain last visit. Did report prev muscle strains but never to this degree. L-spine XR with degenerative disc change, minimal retrolisthesis at L5-S1 but no acute abnormalities. Prescribed flexeril, declined other pain meds, recommended lifting restrictions based on exam that day. He was scheduled to be seen the following day by [facility]. - Pt reports that pain has improved some (~10% better). He is still having radiating pain/numbness into L thigh with bending, standing. Also reports sharp and stabbing pain with coughing and sneezing. Denies bladder/bowel incontinence, saddle anesthesia, abdominal wall pain. He has been taking 1/2 tab Flexeril in the mornings as a full tab causes drowsiness and 2 200 mg ibuprofen qd. Pt states that he did return to [place] on 7/3 but they wanted to start PT, however, pt states he didn't let them due to pain. He states that he has not been able to return to work due  to restrictions.  Patient Active Problem List   Diagnosis Date Noted  . Tobacco abuse 06/30/2013   Past Medical History:  Diagnosis Date  . Allergy   . Anxiety    Past Surgical History:  Procedure Laterality Date  . KIDNEY STONE SURGERY     Allergies  Allergen Reactions  . Amoxicillin     vomiting   Prior to Admission medications   Medication Sig Start Date End Date Taking? Authorizing Provider  cetirizine (ZYRTEC) 10 MG tablet Take 10 mg by mouth daily.    [provider]  cyclobenzaprine (FLEXERIL) 5 MG tablet 1 pill by mouth up to every 8 hours as needed. Start with one pill by mouth each bedtime as needed due to sedation 02/22/18   Wendie Agreste, MD  hydrOXYzine (ATARAX/VISTARIL) 10 MG tablet Take 0.5-1 tablets (5-10 mg total) by mouth at bedtime as needed. Patient not taking: Reported on 03/26/2017 02/06/17   Wendie Agreste, MD   Social History   Socioeconomic History  . Marital status: Married    Spouse name: Not on file  . Number of children: 1  . Years of education: Not on file  . Highest education level: Not on file  Occupational History  . Occupation: pipe fitter  Social Needs  . Financial resource strain: Not on file  . Food insecurity:    Worry: Not on file    Inability: Not on file  . Transportation needs:  Medical: Not on file    Non-medical: Not on file  Tobacco Use  . Smoking status: Current Every Day Smoker    Packs/day: 1.00    Years: 25.00    Pack years: 25.00    Types: Cigarettes  . Smokeless tobacco: Never Used  Substance and Sexual Activity  . Alcohol use: No    Alcohol/week: 0.0 oz  . Drug use: No  . Sexual activity: Not on file  Lifestyle  . Physical activity:    Days per week: Not on file    Minutes per session: Not on file  . Stress: Not on file  Relationships  . Social connections:    Talks on phone: Not on file    Gets together: Not on file    Attends religious service: Not on file    Active member of club or  organization: Not on file    Attends meetings of clubs or organizations: Not on file    Relationship status: Not on file  . Intimate partner violence:    Fear of current or ex partner: Not on file    Emotionally abused: Not on file    Physically abused: Not on file    Forced sexual activity: Not on file  Other Topics Concern  . Not on file  Social History Narrative  . Not on file   Review of Systems  Gastrointestinal: Negative for abdominal pain.  Musculoskeletal: Positive for back pain.  Neurological: Positive for numbness.      Objective:   Physical Exam  Constitutional: He is oriented to person, place, and time. He appears well-developed and well-nourished. No distress.  HENT:  Head: Normocephalic and atraumatic.  Eyes: Conjunctivae and EOM are normal.  Neck: Neck supple. No tracheal deviation present.  Cardiovascular: Normal rate.  Pulmonary/Chest: Effort normal. No respiratory distress.  Musculoskeletal:  Tender with spasms over L paraspinals and mid l-spine. SI joint and sciatic notch are nontender. Able to heel and toe walk without difficulty. Flexion to ~45-50 degrees. Slight decreased R greater than L lateral flexion, decreased L rotation. Neg seated straight leg raise.  Neurological: He is alert and oriented to person, place, and time.  Reflex Scores:      Patellar reflexes are 2+ on the right side and 2+ on the left side.      Achilles reflexes are 2+ on the right side and 2+ on the left side. Skin: Skin is warm and dry.  Psychiatric: He has a normal mood and affect. His behavior is normal.  Nursing note and vitals reviewed.  Vitals:   03/01/18 1121 03/01/18 1122  BP: (!) 146/87 (!) 142/76  Pulse: 95   Temp: 98.3 F (36.8 C)   TempSrc: Oral   SpO2: 96%   Weight: 158 lb 6.4 oz (71.8 kg)   Height: 5\' 10"  (1.778 m)    Results for orders placed or performed in visit on 03/01/18  POCT glucose (manual entry)  Result Value Ref Range   POC Glucose 114 (A) 70 -  99 mg/dl      Assessment & Plan:    Luke Franco is a 62 y.o. male Acute left-sided low back pain with left-sided sciatica - Plan: predniSONE (DELTASONE) 20 MG tablet, cyclobenzaprine (FLEXERIL) 5 MG tablet Low back strain, subsequent encounter - Plan: predniSONE (DELTASONE) 20 MG tablet  -Slight improvement, but still significant symptoms.  Pain with cough/increased intra-abdominal pressure concerning for possible herniated disc.  Also would fit with the dysesthesias on his left  leg.  No cauda equina symptoms at this time.  Various treatment options were discussed as well as evaluation with advanced imaging such as MRI.   -  Initially decided to continue cyclobenzaprine 1/2-1 up to every 8 hours, ibuprofen over-the-counter 400 mg every 6-8 hours with food, continue work restrictions.  -If not improving in the next 5 to 6 days, prescription was printed for prednisone taper for possible HNP.  Side effects and risks of prednisone were discussed with understanding expressed.  -Note with restrictions was provided for work, follow-up in 2 weeks, sooner if worse.   Screening for diabetes mellitus - Plan: POCT glucose (manual entry) ok for prednisone.    Meds ordered this encounter  Medications  . predniSONE (DELTASONE) 20 MG tablet    Sig: 3 by mouth for 3 days, then 2 by mouth for 2 days, then 1 by mouth for 2 days, then 1/2 by mouth for 2 days.    Dispense:  16 tablet    Refill:  0  . cyclobenzaprine (FLEXERIL) 5 MG tablet    Sig: Take 0.5-1 tablets (2.5-5 mg total) by mouth 3 (three) times daily as needed for muscle spasms.    Dispense:  20 tablet    Refill:  0   Patient Instructions   Although you have had some improvement in the back pain, he still may have a herniated disc based on our discussion in your symptoms.  Okay to continue the Flexeril 1/2 to 1 pill up to every 8 hours as needed, watch for sedation with that medication.  You can also continue over-the-counter ibuprofen 400  mg every 6-8 hours as needed with food.  If your symptoms are not significantly improving through this weekend, stop ibuprofen and start prednisone.  Follow-up with me in 2 weeks, sooner if needed.  Return to the clinic or go to the nearest emergency room if any of your symptoms worsen or new symptoms occur.   Back Pain, Adult Many adults have back pain from time to time. Common causes of back pain include:  A strained muscle or ligament.  Wear and tear (degeneration) of the spinal disks.  Arthritis.  A hit to the back.  Back pain can be short-lived (acute) or last a long time (chronic). A physical exam, lab tests, and imaging studies may be done to find the cause of your pain. Follow these instructions at home: Managing pain and stiffness  Take over-the-counter and prescription medicines only as told by your health care provider.  If directed, apply heat to the affected area as often as told by your health care provider. Use the heat source that your health care provider recommends, such as a moist heat pack or a heating pad. ? Place a towel between your skin and the heat source. ? Leave the heat on for 20-30 minutes. ? Remove the heat if your skin turns bright red. This is especially important if you are unable to feel pain, heat, or cold. You have a greater risk of getting burned.  If directed, apply ice to the injured area: ? Put ice in a plastic bag. ? Place a towel between your skin and the bag. ? Leave the ice on for 20 minutes, 2-3 times a day for the first 2-3 days. Activity  Do not stay in bed. Resting more than 1-2 days can delay your recovery.  Take short walks on even surfaces as soon as you are able. Try to increase the length of time you walk  each day.  Do not sit, drive, or stand in one place for more than 30 minutes at a time. Sitting or standing for long periods of time can put stress on your back.  Use proper lifting techniques. When you bend and lift, use  positions that put less stress on your back: ? [place] your knees. ? Keep the load close to your body. ? Avoid twisting.  Exercise regularly as told by your health care provider. Exercising will help your back heal faster. This also helps prevent back injuries by keeping muscles strong and flexible.  Your health care provider may recommend that you see a physical therapist. This person can help you come up with a safe exercise program. Do any exercises as told by your physical therapist. Lifestyle  Maintain a healthy weight. Extra weight puts stress on your back and makes it difficult to have good posture.  Avoid activities or situations that make you feel anxious or stressed. Learn ways to manage anxiety and stress. One way to manage stress is through exercise. Stress and anxiety increase muscle tension and can make back pain worse. General instructions  Sleep on a firm mattress in a comfortable position. Try lying on your side with your knees slightly bent. If you lie on your back, put a pillow under your knees.  Follow your treatment plan as told by your health care provider. This may include: ? Cognitive or behavioral therapy. ? Acupuncture or massage therapy. ? Meditation or yoga. Contact a health care provider if:  You have pain that is not relieved with rest or medicine.  You have increasing pain going down into your legs or buttocks.  Your pain does not improve in 2 weeks.  You have pain at night.  You lose weight.  You have a fever or chills. Get help right away if:  You develop new bowel or bladder control problems.  You have unusual weakness or numbness in your arms or legs.  You develop nausea or vomiting.  You develop abdominal pain.  You feel faint. Summary  Many adults have back pain from time to time. A physical exam, lab tests, and imaging studies may be done to find the cause of your pain.  Use proper lifting techniques. When you bend and lift, use  positions that put less stress on your back.  Take over-the-counter and prescription medicines and apply heat or ice as directed by your health care provider. This information is not intended to replace advice given to you by your health care provider. Make sure you discuss any questions you have with your health care provider. Document Released: 08/10/2005 Document Revised: 09/14/2016 Document Reviewed: 09/14/2016 Elsevier Interactive Patient Education  2018 [employer].   IF you received an x-ray today, you will receive an invoice from [facility]. Please contact [facility] at [phone] with questions or concerns regarding your invoice.   IF you received labwork today, you will receive an invoice from [place]. Please contact [facility] at [phone] with questions or concerns regarding your invoice.   Our billing staff will not be able to assist you with questions regarding bills from these companies.  You will be contacted with the lab results as soon as they are available. The fastest way to get your results is to activate your My Chart account. Instructions are located on the last page of this paperwork. If you have not heard from [place] regarding the results in 2 weeks, please contact this office.  I personally performed the services described in this documentation, which was scribed in my presence. The recorded information has been reviewed and considered for accuracy and completeness, addended by me as needed, and agree with information above.  Signed,   [name], MD Primary Care at [place].  03/01/18 2:50 PM

## 2018-03-01 NOTE — Patient Instructions (Addendum)
Although you have had some improvement in the back pain, he still may have a herniated disc based on our discussion in your symptoms.  Okay to continue the Flexeril 1/2 to 1 pill up to every 8 hours as needed, watch for sedation with that medication.  You can also continue over-the-counter ibuprofen 400 mg every 6-8 hours as needed with food.  If your symptoms are not significantly improving through this weekend, stop ibuprofen and start prednisone.  Follow-up with me in 2 weeks, sooner if needed.  Return to the clinic or go to the nearest emergency room if any of your symptoms worsen or new symptoms occur.   Back Pain, Adult Many adults have back pain from time to time. Common causes of back pain include:  A strained muscle or ligament.  Wear and tear (degeneration) of the spinal disks.  Arthritis.  A hit to the back.  Back pain can be short-lived (acute) or last a long time (chronic). A physical exam, lab tests, and imaging studies may be done to find the cause of your pain. Follow these instructions at home: Managing pain and stiffness  Take over-the-counter and prescription medicines only as told by your health care provider.  If directed, apply heat to the affected area as often as told by your health care provider. Use the heat source that your health care provider recommends, such as a moist heat pack or a heating pad. ? Place a towel between your skin and the heat source. ? Leave the heat on for 20-30 minutes. ? Remove the heat if your skin turns bright red. This is especially important if you are unable to feel pain, heat, or cold. You have a greater risk of getting burned.  If directed, apply ice to the injured area: ? Put ice in a plastic bag. ? Place a towel between your skin and the bag. ? Leave the ice on for 20 minutes, 2-3 times a day for the first 2-3 days. Activity  Do not stay in bed. Resting more than 1-2 days can delay your recovery.  Take short walks on even  surfaces as soon as you are able. Try to increase the length of time you walk each day.  Do not sit, drive, or stand in one place for more than 30 minutes at a time. Sitting or standing for long periods of time can put stress on your back.  Use proper lifting techniques. When you bend and lift, use positions that put less stress on your back: ? [place] your knees. ? Keep the load close to your body. ? Avoid twisting.  Exercise regularly as told by your health care provider. Exercising will help your back heal faster. This also helps prevent back injuries by keeping muscles strong and flexible.  Your health care provider may recommend that you see a physical therapist. This person can help you come up with a safe exercise program. Do any exercises as told by your physical therapist. Lifestyle  Maintain a healthy weight. Extra weight puts stress on your back and makes it difficult to have good posture.  Avoid activities or situations that make you feel anxious or stressed. Learn ways to manage anxiety and stress. One way to manage stress is through exercise. Stress and anxiety increase muscle tension and can make back pain worse. General instructions  Sleep on a firm mattress in a comfortable position. Try lying on your side with your knees slightly bent. If you lie on your back, put a  pillow under your knees.  Follow your treatment plan as told by your health care provider. This may include: ? Cognitive or behavioral therapy. ? Acupuncture or massage therapy. ? Meditation or yoga. Contact a health care provider if:  You have pain that is not relieved with rest or medicine.  You have increasing pain going down into your legs or buttocks.  Your pain does not improve in 2 weeks.  You have pain at night.  You lose weight.  You have a fever or chills. Get help right away if:  You develop new bowel or bladder control problems.  You have unusual weakness or numbness in your arms or  legs.  You develop nausea or vomiting.  You develop abdominal pain.  You feel faint. Summary  Many adults have back pain from time to time. A physical exam, lab tests, and imaging studies may be done to find the cause of your pain.  Use proper lifting techniques. When you bend and lift, use positions that put less stress on your back.  Take over-the-counter and prescription medicines and apply heat or ice as directed by your health care provider. This information is not intended to replace advice given to you by your health care provider. Make sure you discuss any questions you have with your health care provider. Document Released: 08/10/2005 Document Revised: 09/14/2016 Document Reviewed: 09/14/2016 Elsevier Interactive Patient Education  2018 [employer].   IF you received an x-ray today, you will receive an invoice from [facility]. Please contact [facility] at [phone] with questions or concerns regarding your invoice.   IF you received labwork today, you will receive an invoice from [place]. Please contact [facility] at [phone] with questions or concerns regarding your invoice.   Our billing staff will not be able to assist you with questions regarding bills from these companies.  You will be contacted with the lab results as soon as they are available. The fastest way to get your results is to activate your My Chart account. Instructions are located on the last page of this paperwork. If you have not heard from [place] regarding the results in 2 weeks, please contact this office.

## 2018-03-07 ENCOUNTER — Telehealth: Payer: Self-pay | Admitting: Family Medicine

## 2018-03-07 NOTE — Telephone Encounter (Signed)
Copied from [place] [id number]. Topic: Medical Record Request - Other >> Mar 02, 2018  1:29 PM Scherrie Gerlach wrote: Reason for CRM: laura from [facility] calling because they just received a claim, starting 02/21/18 and they do not have anything.  Need work status and exam notes Fax: [phone] All office visits

## 2018-03-07 NOTE — Telephone Encounter (Unsigned)
Copied from [place]. Topic: Quick Communication - See Telephone Encounter >> Mar 07, 2018  3:53 PM Percell Belt A wrote: CRM for notification. See Telephone encounter for: 03/07/18.    Pt called and stated that he took the predisone like he was suppose to today but about an 1 after he took it, they call came right back up.  He stated that it does not make him feel good.  He stated that he can not take these   Please advise

## 2018-03-07 NOTE — Telephone Encounter (Signed)
Pt message re: prednisone side effects sent to Dr. Carlota Raspberry

## 2018-03-09 NOTE — Telephone Encounter (Signed)
Stop prednisone and return to just ibuprofen with food for now and flexeril. If any continued nausea or vomiting, follow-up for evaluation.  If back pain is continuing to improve on ibuprofen and Flexeril, can keep appointment as planned.  Sooner if worse

## 2018-03-10 NOTE — Telephone Encounter (Signed)
Spoke with pt, has an appt on Monday with Carlota Raspberry.

## 2018-03-14 ENCOUNTER — Encounter: Payer: Self-pay | Admitting: Family Medicine

## 2018-03-14 ENCOUNTER — Other Ambulatory Visit: Payer: Self-pay

## 2018-03-14 ENCOUNTER — Ambulatory Visit (INDEPENDENT_AMBULATORY_CARE_PROVIDER_SITE_OTHER): Payer: Worker's Compensation | Admitting: Family Medicine

## 2018-03-14 DIAGNOSIS — M5442 Lumbago with sciatica, left side: Secondary | ICD-10-CM

## 2018-03-14 MED ORDER — CYCLOBENZAPRINE HCL 5 MG PO TABS: 2.5000 mg | ORAL_TABLET | Freq: Three times a day (TID) | ORAL | 0 refills | Status: DC | PRN

## 2018-03-14 NOTE — Progress Notes (Signed)
Subjective:  By signing my name below, I, Delton Coombes, attest that this documentation has been prepared under the directions and in the prescence of Wendie Agreste, MD. Electronically Signed: Delton Coombes, Medical Scribe 03/14/2018 at 2:58 PM.   Patient ID: Luke Franco, male    DOB: [birth year], 62 y.o.   MRN: [id number] Chief Complaint  Patient presents with  . Back Pain    middle area. 2 week f/u   . Medication Refill    flexeril   HPI Luke Franco is a 62 y.o. male who presents to Primary Care at [facility] here for a follow up of low back pain. Last seen July 9th, had a low back strain possible HMP, when at work on July 1st. Initial evaluation on July 2nd. Some improvement with Flexeril, and ibuprofen, but was changed to prednisone for possible HNP, and placed on work restrictions. He did not tolerate prednisone, reported vomiting.   Patient reports he takes Flexeril once a night, and expressed that his back pain feeling better. He feels he is at 40% with his back pain improvement. Denies loss of stool.  Reports pain on left thigh to knee. He attempted three prednisone with food but felt sick, he called in clinic to talk about symptoms, he reported that the nurse told him to stop taking Prednisone. He reports taking two 200 mg of ibuprofen. He denies any stomach ulcer in the past. Denies having incontinence.    Patient Active Problem List   Diagnosis Date Noted  . Tobacco abuse 06/30/2013   Past Medical History:  Diagnosis Date  . Allergy   . Anxiety    Past Surgical History:  Procedure Laterality Date  . KIDNEY STONE SURGERY     Allergies  Allergen Reactions  . Amoxicillin     vomiting  . Prednisone Nausea And Vomiting   Prior to Admission medications   Medication Sig Start Date End Date Taking? Authorizing Provider  cetirizine (ZYRTEC) 10 MG tablet Take 10 mg by mouth daily.    [provider]  cyclobenzaprine (FLEXERIL) 5 MG tablet Take 0.5-1 tablets  (2.5-5 mg total) by mouth 3 (three) times daily as needed for muscle spasms. 03/01/18   Wendie Agreste, MD  hydrOXYzine (ATARAX/VISTARIL) 10 MG tablet Take 0.5-1 tablets (5-10 mg total) by mouth at bedtime as needed. 02/06/17   Wendie Agreste, MD  predniSONE (DELTASONE) 20 MG tablet 3 by mouth for 3 days, then 2 by mouth for 2 days, then 1 by mouth for 2 days, then 1/2 by mouth for 2 days. 03/01/18   Wendie Agreste, MD   Social History   Socioeconomic History  . Marital status: Married    Spouse name: Not on file  . Number of children: 1  . Years of education: Not on file  . Highest education level: Not on file  Occupational History  . Occupation: pipe fitter  Social Needs  . Financial resource strain: Not on file  . Food insecurity:    Worry: Not on file    Inability: Not on file  . Transportation needs:    Medical: Not on file    Non-medical: Not on file  Tobacco Use  . Smoking status: Current Every Day Smoker    Packs/day: 1.00    Years: 25.00    Pack years: 25.00    Types: Cigarettes  . Smokeless tobacco: Never Used  Substance and Sexual Activity  . Alcohol use: No    Alcohol/week: 0.0  oz  . Drug use: No  . Sexual activity: Not on file  Lifestyle  . Physical activity:    Days per week: Not on file    Minutes per session: Not on file  . Stress: Not on file  Relationships  . Social connections:    Talks on phone: Not on file    Gets together: Not on file    Attends religious service: Not on file    Active member of club or organization: Not on file    Attends meetings of clubs or organizations: Not on file    Relationship status: Not on file  . Intimate partner violence:    Fear of current or ex partner: Not on file    Emotionally abused: Not on file    Physically abused: Not on file    Forced sexual activity: Not on file  Other Topics Concern  . Not on file  Social History Narrative  . Not on file     Review of Systems     Objective:   Physical  Exam  Constitutional: He is oriented to person, place, and time. He appears well-developed.  Pulmonary/Chest: Effort normal.  Neurological: He is alert and oriented to person, place, and time.  Reflex Scores:      Patellar reflexes are 2+ on the right side and 2+ on the left side.      Achilles reflexes are 2+ on the right side and 2+ on the left side. Psychiatric: He has a normal mood and affect. His behavior is normal. Judgment and thought content normal.   Vitals:   03/14/18 1426 03/14/18 1428  BP: (!) 148/88 130/82  Pulse: 92   Temp: 98.8 F (37.1 C)   TempSrc: Oral   SpO2: 97%   Weight: 160 lb 12.8 oz (72.9 kg)   Height: 5' 10.5" (1.791 m)    Pain on the lower lumbar spine.  Slight tenderness on the mid lower lumbar spine. He is able to heel and toe wall. Lumbar flexion approximately 60 degrees. Extensions are intact. Decreased left lateral flexion. Rotation is intact. Negative seated straight leg raise, bilaterally.      Assessment & Plan:    Luke Franco is a 62 y.o. male Acute left-sided low back pain with left-sided sciatica - Plan: cyclobenzaprine (FLEXERIL) 5 MG tablet  Low back strain with possible disc herniation in differential.  Overall improving.  Did not tolerate prednisone.    -Decided to continue Flexeril, ibuprofen, range of motion as tolerated and recheck in 2 weeks to decide if advanced imaging with MRI, physical therapy trial or orthopedic evaluation needed at that time.  Continue restrictions until that time, RTC precautions if worsening  Meds ordered this encounter  Medications  . cyclobenzaprine (FLEXERIL) 5 MG tablet    Sig: Take 0.5-1 tablets (2.5-5 mg total) by mouth 3 (three) times daily as needed for muscle spasms.    Dispense:  20 tablet    Refill:  0   Patient Instructions    You may still have a herniated disc, but as pain is improving, can continue the flexeril for now, ibuprofen up to 600mg  every 6--8 hours as needed with food.   Recheck in next 2 weeks to see if other imaging needed, physical therapy, or orthopaedist.  Return to the clinic or go to the nearest emergency room if any of your symptoms worsen or new symptoms occur.   Back Pain, Adult Many adults have back pain from time to time.  Common causes of back pain include:  A strained muscle or ligament.  Wear and tear (degeneration) of the spinal disks.  Arthritis.  A hit to the back.  Back pain can be short-lived (acute) or last a long time (chronic). A physical exam, lab tests, and imaging studies may be done to find the cause of your pain. Follow these instructions at home: Managing pain and stiffness  Take over-the-counter and prescription medicines only as told by your health care provider.  If directed, apply heat to the affected area as often as told by your health care provider. Use the heat source that your health care provider recommends, such as a moist heat pack or a heating pad. ? Place a towel between your skin and the heat source. ? Leave the heat on for 20-30 minutes. ? Remove the heat if your skin turns bright red. This is especially important if you are unable to feel pain, heat, or cold. You have a greater risk of getting burned.  If directed, apply ice to the injured area: ? Put ice in a plastic bag. ? Place a towel between your skin and the bag. ? Leave the ice on for 20 minutes, 2-3 times a day for the first 2-3 days. Activity  Do not stay in bed. Resting more than 1-2 days can delay your recovery.  Take short walks on even surfaces as soon as you are able. Try to increase the length of time you walk each day.  Do not sit, drive, or stand in one place for more than 30 minutes at a time. Sitting or standing for long periods of time can put stress on your back.  Use proper lifting techniques. When you bend and lift, use positions that put less stress on your back: ? [place] your knees. ? Keep the load close to your body. ? Avoid  twisting.  Exercise regularly as told by your health care provider. Exercising will help your back heal faster. This also helps prevent back injuries by keeping muscles strong and flexible.  Your health care provider may recommend that you see a physical therapist. This person can help you come up with a safe exercise program. Do any exercises as told by your physical therapist. Lifestyle  Maintain a healthy weight. Extra weight puts stress on your back and makes it difficult to have good posture.  Avoid activities or situations that make you feel anxious or stressed. Learn ways to manage anxiety and stress. One way to manage stress is through exercise. Stress and anxiety increase muscle tension and can make back pain worse. General instructions  Sleep on a firm mattress in a comfortable position. Try lying on your side with your knees slightly bent. If you lie on your back, put a pillow under your knees.  Follow your treatment plan as told by your health care provider. This may include: ? Cognitive or behavioral therapy. ? Acupuncture or massage therapy. ? Meditation or yoga. Contact a health care provider if:  You have pain that is not relieved with rest or medicine.  You have increasing pain going down into your legs or buttocks.  Your pain does not improve in 2 weeks.  You have pain at night.  You lose weight.  You have a fever or chills. Get help right away if:  You develop new bowel or bladder control problems.  You have unusual weakness or numbness in your arms or legs.  You develop nausea or vomiting.  You develop abdominal  pain.  You feel faint. Summary  Many adults have back pain from time to time. A physical exam, lab tests, and imaging studies may be done to find the cause of your pain.  Use proper lifting techniques. When you bend and lift, use positions that put less stress on your back.  Take over-the-counter and prescription medicines and apply heat or  ice as directed by your health care provider. This information is not intended to replace advice given to you by your health care provider. Make sure you discuss any questions you have with your health care provider. Document Released: 08/10/2005 Document Revised: 09/14/2016 Document Reviewed: 09/14/2016 Elsevier Interactive Patient Education  2018 [employer].    IF you received an x-ray today, you will receive an invoice from [facility]. Please contact [facility] at [phone] with questions or concerns regarding your invoice.   IF you received labwork today, you will receive an invoice from [facility]. Please contact [facility] at [phone] with questions or concerns regarding your invoice.   Our billing staff will not be able to assist you with questions regarding bills from these companies.  You will be contacted with the lab results as soon as they are available. The fastest way to get your results is to activate your My Chart account. Instructions are located on the last page of this paperwork. If you have not heard from [place] regarding the results in 2 weeks, please contact this office.       I personally performed the services described in this documentation, which was scribed in my presence. The recorded information has been reviewed and considered for accuracy and completeness, addended by me as needed, and agree with information above.  Signed,   [name], MD Primary [facility] at [facility].  03/16/18 3:34 PM

## 2018-03-14 NOTE — Patient Instructions (Addendum)
You may still have a herniated disc, but as pain is improving, can continue the flexeril for now, ibuprofen up to 600mg  every 6--8 hours as needed with food.  Recheck in next 2 weeks to see if other imaging needed, physical therapy, or orthopaedist.  Return to the clinic or go to the nearest emergency room if any of your symptoms worsen or new symptoms occur.   Back Pain, Adult Many adults have back pain from time to time. Common causes of back pain include:  A strained muscle or ligament.  Wear and tear (degeneration) of the spinal disks.  Arthritis.  A hit to the back.  Back pain can be short-lived (acute) or last a long time (chronic). A physical exam, lab tests, and imaging studies may be done to find the cause of your pain. Follow these instructions at home: Managing pain and stiffness  Take over-the-counter and prescription medicines only as told by your health care provider.  If directed, apply heat to the affected area as often as told by your health care provider. Use the heat source that your health care provider recommends, such as a moist heat pack or a heating pad. ? Place a towel between your skin and the heat source. ? Leave the heat on for 20-30 minutes. ? Remove the heat if your skin turns bright red. This is especially important if you are unable to feel pain, heat, or cold. You have a greater risk of getting burned.  If directed, apply ice to the injured area: ? Put ice in a plastic bag. ? Place a towel between your skin and the bag. ? Leave the ice on for 20 minutes, 2-3 times a day for the first 2-3 days. Activity  Do not stay in bed. Resting more than 1-2 days can delay your recovery.  Take short walks on even surfaces as soon as you are able. Try to increase the length of time you walk each day.  Do not sit, drive, or stand in one place for more than 30 minutes at a time. Sitting or standing for long periods of time can put stress on your back.  Use  proper lifting techniques. When you bend and lift, use positions that put less stress on your back: ? [place] your knees. ? Keep the load close to your body. ? Avoid twisting.  Exercise regularly as told by your health care provider. Exercising will help your back heal faster. This also helps prevent back injuries by keeping muscles strong and flexible.  Your health care provider may recommend that you see a physical therapist. This person can help you come up with a safe exercise program. Do any exercises as told by your physical therapist. Lifestyle  Maintain a healthy weight. Extra weight puts stress on your back and makes it difficult to have good posture.  Avoid activities or situations that make you feel anxious or stressed. Learn ways to manage anxiety and stress. One way to manage stress is through exercise. Stress and anxiety increase muscle tension and can make back pain worse. General instructions  Sleep on a firm mattress in a comfortable position. Try lying on your side with your knees slightly bent. If you lie on your back, put a pillow under your knees.  Follow your treatment plan as told by your health care provider. This may include: ? Cognitive or behavioral therapy. ? Acupuncture or massage therapy. ? Meditation or yoga. Contact a health care provider if:  You have pain that  is not relieved with rest or medicine.  You have increasing pain going down into your legs or buttocks.  Your pain does not improve in 2 weeks.  You have pain at night.  You lose weight.  You have a fever or chills. Get help right away if:  You develop new bowel or bladder control problems.  You have unusual weakness or numbness in your arms or legs.  You develop nausea or vomiting.  You develop abdominal pain.  You feel faint. Summary  Many adults have back pain from time to time. A physical exam, lab tests, and imaging studies may be done to find the cause of your pain.  Use  proper lifting techniques. When you bend and lift, use positions that put less stress on your back.  Take over-the-counter and prescription medicines and apply heat or ice as directed by your health care provider. This information is not intended to replace advice given to you by your health care provider. Make sure you discuss any questions you have with your health care provider. Document Released: 08/10/2005 Document Revised: 09/14/2016 Document Reviewed: 09/14/2016 Elsevier Interactive Patient Education  2018 [employer].    IF you received an x-ray today, you will receive an invoice from [facility]. Please contact [facility] at [phone] with questions or concerns regarding your invoice.   IF you received labwork today, you will receive an invoice from [facility]. Please contact [facility] at [phone] with questions or concerns regarding your invoice.   Our billing staff will not be able to assist you with questions regarding bills from these companies.  You will be contacted with the lab results as soon as they are available. The fastest way to get your results is to activate your My Chart account. Instructions are located on the last page of this paperwork. If you have not heard from [place] regarding the results in 2 weeks, please contact this office.

## 2018-03-14 NOTE — Telephone Encounter (Signed)
We have not received any request for records or anything showing a claim for this patient

## 2018-03-16 ENCOUNTER — Encounter: Payer: Self-pay | Admitting: Family Medicine

## 2018-04-01 ENCOUNTER — Encounter: Payer: Self-pay | Admitting: Family Medicine

## 2018-04-01 ENCOUNTER — Ambulatory Visit (INDEPENDENT_AMBULATORY_CARE_PROVIDER_SITE_OTHER): Payer: Worker's Compensation | Admitting: Family Medicine

## 2018-04-01 ENCOUNTER — Other Ambulatory Visit: Payer: Self-pay

## 2018-04-01 DIAGNOSIS — M5442 Lumbago with sciatica, left side: Secondary | ICD-10-CM

## 2018-04-01 MED ORDER — CYCLOBENZAPRINE HCL 5 MG PO TABS: 2.5000 mg | ORAL_TABLET | Freq: Three times a day (TID) | ORAL | 0 refills | Status: DC | PRN

## 2018-04-01 NOTE — Patient Instructions (Addendum)
Continue Flexeril as needed at night for muscle spasm, ibuprofen during the day up to every 6 hours with food.  Stop that medicine if it causes any stomach irritation.  I will refer you to physical therapy, but continue range of motion and stretches for back throughout the day.  Continue to avoid heavy lifting for now until we see some further improvement.  Follow-up in the next 3 weeks.    Low Back Strain A strain is a stretch or tear in a muscle or the strong cords of tissue that attach muscle to bone (tendons). Strains of the lower back (lumbar spine) are a common cause of low back pain. A strain occurs when muscles or tendons are torn or are stretched beyond their limits. The muscles may become inflamed, resulting in pain and sudden muscle tightening (spasms). A strain can happen suddenly due to an injury (trauma), or it can develop gradually due to overuse. There are three types of strains:  Grade 1 is a mild strain involving a minor tear of the muscle fibers or tendons. This may cause some pain but no loss of muscle strength.  Grade 2 is a moderate strain involving a partial tear of the muscle fibers or tendons. This causes more severe pain and some loss of muscle strength.  Grade 3 is a severe strain involving a complete tear of the muscle or tendon. This causes severe pain and complete or nearly complete loss of muscle strength.  What are the causes? This condition may be caused by:  Trauma, such as a fall or a hit to the body.  Twisting or overstretching the back. This may result from doing activities that require a lot of energy, such as lifting heavy objects.  What increases the risk? The following factors may increase your risk of getting this condition:  Playing contact sports.  Participating in sports or activities that put excessive stress on the back and require a lot of bending and twisting, including: ? Lifting weights or heavy  objects. ? Gymnastics. ? Soccer. ? Figure skating. ? Snowboarding.  Being overweight or obese.  Having poor strength and flexibility.  What are the signs or symptoms? Symptoms of this condition may include:  Sharp or dull pain in the lower back that does not go away. Pain may extend to the buttocks.  Stiffness.  Limited range of motion.  Inability to stand up straight due to stiffness or pain.  Muscle spasms.  How is this diagnosed? This condition may be diagnosed based on:  Your symptoms.  Your medical history.  A physical exam. ? Your health care provider may push on certain areas of your back to determine the source of your pain. ? You may be asked to bend forward, backward, and side to side to assess the severity of your pain and your range of motion.  Imaging tests, such as: ? X-rays. ? MRI.  How is this treated? Treatment for this condition may include:  Applying heat and cold to the affected area.  Medicines to help relieve pain and to relax your muscles (muscle relaxants).  NSAIDs to help reduce swelling and discomfort.  Physical therapy.  When your symptoms improve, it is important to gradually return to your normal routine as soon as possible to reduce pain, avoid stiffness, and avoid loss of muscle strength. Generally, symptoms should improve within 6 weeks of treatment. However, recovery time varies. Follow these instructions at home: Managing pain, stiffness, and swelling  If directed, apply ice to  the injured area during the first 24 hours after your injury. ? Put ice in a plastic bag. ? Place a towel between your skin and the bag. ? Leave the ice on for 20 minutes, 2-3 times a day.  If directed, apply heat to the affected area as often as told by your health care provider. Use the heat source that your health care provider recommends, such as a moist heat pack or a heating pad. ? Place a towel between your skin and the heat source. ? Leave  the heat on for 20-30 minutes. ? Remove the heat if your skin turns bright red. This is especially important if you are unable to feel pain, heat, or cold. You may have a greater risk of getting burned. Activity  Rest and return to your normal activities as told by your health care provider. Ask your health care provider what activities are safe for you.  Avoid activities that take a lot of effort (are strenuous) for as long as told by your health care provider.  Do exercises as told by your health care provider. General instructions   Take over-the-counter and prescription medicines only as told by your health care provider.  If you have questions or concerns about safety while taking pain medicine, talk with your health care provider.  Do not drive or operate heavy machinery until you know how your pain medicine affects you.  Do not use any tobacco products, such as cigarettes, chewing tobacco, and e-cigarettes. Tobacco can delay bone healing. If you need help quitting, ask your health care provider.  Keep all follow-up visits as told by your health care provider. This is important. How is this prevented?  Warm up and stretch before being active.  Cool down and stretch after being active.  Give your body time to rest between periods of activity.  Avoid: ? Being physically inactive for long periods at a time. ? Exercising or playing sports when you are tired or in pain.  Use correct form when playing sports and lifting heavy objects.  Use good posture when sitting and standing.  Maintain a healthy weight.  Sleep on a mattress with medium firmness to support your back.  Make sure to use equipment that fits you, including shoes that fit well.  Be safe and responsible while being active to avoid falls.  Do at least 150 minutes of moderate-intensity exercise each week, such as brisk walking or water aerobics. Try a form of exercise that takes stress off your back, such as  swimming or stationary cycling.  Maintain physical fitness, including: ? Strength. ? Flexibility. ? Cardiovascular fitness. ? Endurance. Contact a health care provider if:  Your back pain does not improve after 6 weeks of treatment.  Your symptoms get worse. Get help right away if:  Your back pain is severe.  You are unable to stand or walk.  You develop pain in your legs.  You develop weakness in your buttocks or legs.  You have difficulty controlling when you urinate or when you have a bowel movement. This information is not intended to replace advice given to you by your health care provider. Make sure you discuss any questions you have with your health care provider. Document Released: 08/10/2005 Document Revised: 04/16/2016 Document Reviewed: 05/22/2015 Elsevier Interactive Patient Education  2017 [employer].   IF you received an x-ray today, you will receive an invoice from [facility]. Please contact [facility] at [phone] with questions or concerns regarding your invoice.  IF you received labwork today, you will receive an invoice from [facility]. Please contact [facility] at [phone] with questions or concerns regarding your invoice.   Our billing staff will not be able to assist you with questions regarding bills from these companies.  You will be contacted with the lab results as soon as they are available. The fastest way to get your results is to activate your My Chart account. Instructions are located on the last page of this paperwork. If you have not heard from [place] regarding the results in 2 weeks, please contact this office.

## 2018-04-01 NOTE — Progress Notes (Signed)
Subjective:    Patient ID: Luke Franco, male    DOB: 05-07-1956, 63 y.o.   MRN: [id number]  HPI Luke Franco is a 62 y.o. male Presents today for: Chief Complaint  Patient presents with  . Back Pain    2 week f/u. back pain not improving since last visit   Here for follow-up of low back pain.  Initial injury July 1, had been evaluated elsewhere and saw me on July 2.  Suspected low back strain, possible HNP.  Initially treated with work restrictions Flexeril, ibuprofen.  With persistent symptoms did try prednisone, but had vomiting with that so stopped prednisone.  At last visit on July 22 he was 40% improved.  Had been taking Flexeril once at night.  Did still have some pain radiating to from the left thigh to the knee.  Was also taking 200 mg of ibuprofen.  Tolerating that medication and reports no history of peptic ulcers.  As he was improving last visit we decided to continue Flexeril, ibuprofen, with range of motion exercises at home.  He was continued on work restrictions as well.  Here for follow-up.   Since last visit - still having spasms in low back. Not having catching sensation with movemnt as much - just occasional catch with twist. Flexeril at night about 3 days a week for spasm in left lower back, and ibuprofen few times per week. Has been using senokot for constipation.  Last pain radiating to leg and numbness was last week.  No radiating pain now into leg. 45% better. Still on restrictions at work - unable to return until lift 70-100 pounds.   No bowel or bladder incontinence, no saddle anesthesia, no lower extremity weakness. No cane/crutch/assistive device.     Patient Active Problem List   Diagnosis Date Noted  . Tobacco abuse 06/30/2013   Past Medical History:  Diagnosis Date  . Allergy   . Anxiety    Past Surgical History:  Procedure Laterality Date  . KIDNEY STONE SURGERY     Allergies  Allergen Reactions  . Amoxicillin     vomiting  . Prednisone  Nausea And Vomiting   Prior to Admission medications   Medication Sig Start Date End Date Taking? Authorizing Provider  cetirizine (ZYRTEC) 10 MG tablet Take 10 mg by mouth daily.   Yes [provider]  cyclobenzaprine (FLEXERIL) 5 MG tablet Take 0.5-1 tablets (2.5-5 mg total) by mouth 3 (three) times daily as needed for muscle spasms. 03/14/18  Yes Wendie Agreste, MD  hydrOXYzine (ATARAX/VISTARIL) 10 MG tablet Take 0.5-1 tablets (5-10 mg total) by mouth at bedtime as needed. 02/06/17  Yes Wendie Agreste, MD   Social History   Socioeconomic History  . Marital status: Married    Spouse name: Not on file  . Number of children: 1  . Years of education: Not on file  . Highest education level: Not on file  Occupational History  . Occupation: pipe fitter  Social Needs  . Financial resource strain: Not on file  . Food insecurity:    Worry: Not on file    Inability: Not on file  . Transportation needs:    Medical: Not on file    Non-medical: Not on file  Tobacco Use  . Smoking status: Current Every Day Smoker    Packs/day: 1.00    Years: 25.00    Pack years: 25.00    Types: Cigarettes  . Smokeless tobacco: Never Used  Substance and  Sexual Activity  . Alcohol use: No    Alcohol/week: 0.0 standard drinks  . Drug use: No  . Sexual activity: Not on file  Lifestyle  . Physical activity:    Days per week: Not on file    Minutes per session: Not on file  . Stress: Not on file  Relationships  . Social connections:    Talks on phone: Not on file    Gets together: Not on file    Attends religious service: Not on file    Active member of club or organization: Not on file    Attends meetings of clubs or organizations: Not on file    Relationship status: Not on file  . Intimate partner violence:    Fear of current or ex partner: Not on file    Emotionally abused: Not on file    Physically abused: Not on file    Forced sexual activity: Not on file  Other Topics Concern    . Not on file  Social History Narrative  . Not on file    Review of Systems Per HPI.     Objective:   Physical Exam  Constitutional: He appears well-developed and well-nourished.  HENT:  Head: Normocephalic and atraumatic.  Neck: Normal range of motion.  Pulmonary/Chest: Effort normal.  Abdominal: Soft. There is no tenderness.  Musculoskeletal: He exhibits tenderness.       Lumbar back: He exhibits tenderness and spasm. He exhibits normal range of motion and no bony tenderness.  Neurological: He is alert. He has normal strength. No sensory deficit.  Reflex Scores:      Patellar reflexes are 2+ on the right side and 2+ on the left side.      Achilles reflexes are 2+ on the right side and 2+ on the left side. Able to walk without difficulty or assistive device.   Skin: Skin is warm and dry.  Psychiatric: He has a normal mood and affect. His behavior is normal.  ttp left lower paraspinals, Sciatic notch NT, SI NT.  Vitals:   04/01/18 1228 04/01/18 1230  BP: (!) 144/80 136/60  Pulse: (!) 101   Temp: 98.1 F (36.7 C)   TempSrc: Oral   SpO2: 97%   Weight: 163 lb 12.8 oz (74.3 kg)   Height: 5\' 10"  (1.778 m)        Assessment & Plan:    Luke Franco is a 62 y.o. male Acute left-sided low back pain with left-sided sciatica - Plan: cyclobenzaprine (FLEXERIL) 5 MG tablet, Ambulatory referral to Physical Therapy  Low back strain, some improvement but still some persistent discomfort on left side.  Differential still includes herniated disc but radicular symptoms have resolved.    -Will refer to physical therapy, continue over-the-counter NSAID as needed with food, Flexeril refilled as needed for muscle spasm.  Continue restrictions and recheck in 3 weeks.  Meds ordered this encounter  Medications  . cyclobenzaprine (FLEXERIL) 5 MG tablet    Sig: Take 0.5-1 tablets (2.5-5 mg total) by mouth 3 (three) times daily as needed for muscle spasms.    Dispense:  20 tablet     Refill:  0   Patient Instructions   Continue Flexeril as needed at night for muscle spasm, ibuprofen during the day up to every 6 hours with food.  Stop that medicine if it causes any stomach irritation.  I will refer you to physical therapy, but continue range of motion and stretches for back throughout the day.  Continue to avoid heavy lifting for now until we see some further improvement.  Follow-up in the next 3 weeks.    Low Back Strain A strain is a stretch or tear in a muscle or the strong cords of tissue that attach muscle to bone (tendons). Strains of the lower back (lumbar spine) are a common cause of low back pain. A strain occurs when muscles or tendons are torn or are stretched beyond their limits. The muscles may become inflamed, resulting in pain and sudden muscle tightening (spasms). A strain can happen suddenly due to an injury (trauma), or it can develop gradually due to overuse. There are three types of strains:  Grade 1 is a mild strain involving a minor tear of the muscle fibers or tendons. This may cause some pain but no loss of muscle strength.  Grade 2 is a moderate strain involving a partial tear of the muscle fibers or tendons. This causes more severe pain and some loss of muscle strength.  Grade 3 is a severe strain involving a complete tear of the muscle or tendon. This causes severe pain and complete or nearly complete loss of muscle strength.  What are the causes? This condition may be caused by:  Trauma, such as a fall or a hit to the body.  Twisting or overstretching the back. This may result from doing activities that require a lot of energy, such as lifting heavy objects.  What increases the risk? The following factors may increase your risk of getting this condition:  Playing contact sports.  Participating in sports or activities that put excessive stress on the back and require a lot of bending and twisting, including: ? Lifting weights or heavy  objects. ? Gymnastics. ? Soccer. ? Figure skating. ? Snowboarding.  Being overweight or obese.  Having poor strength and flexibility.  What are the signs or symptoms? Symptoms of this condition may include:  Sharp or dull pain in the lower back that does not go away. Pain may extend to the buttocks.  Stiffness.  Limited range of motion.  Inability to stand up straight due to stiffness or pain.  Muscle spasms.  How is this diagnosed? This condition may be diagnosed based on:  Your symptoms.  Your medical history.  A physical exam. ? Your health care provider may push on certain areas of your back to determine the source of your pain. ? You may be asked to bend forward, backward, and side to side to assess the severity of your pain and your range of motion.  Imaging tests, such as: ? X-rays. ? MRI.  How is this treated? Treatment for this condition may include:  Applying heat and cold to the affected area.  Medicines to help relieve pain and to relax your muscles (muscle relaxants).  NSAIDs to help reduce swelling and discomfort.  Physical therapy.  When your symptoms improve, it is important to gradually return to your normal routine as soon as possible to reduce pain, avoid stiffness, and avoid loss of muscle strength. Generally, symptoms should improve within 6 weeks of treatment. However, recovery time varies. Follow these instructions at home: Managing pain, stiffness, and swelling  If directed, apply ice to the injured area during the first 24 hours after your injury. ? Put ice in a plastic bag. ? Place a towel between your skin and the bag. ? Leave the ice on for 20 minutes, 2-3 times a day.  If directed, apply heat to the affected area as often  as told by your health care provider. Use the heat source that your health care provider recommends, such as a moist heat pack or a heating pad. ? Place a towel between your skin and the heat source. ? Leave  the heat on for 20-30 minutes. ? Remove the heat if your skin turns bright red. This is especially important if you are unable to feel pain, heat, or cold. You may have a greater risk of getting burned. Activity  Rest and return to your normal activities as told by your health care provider. Ask your health care provider what activities are safe for you.  Avoid activities that take a lot of effort (are strenuous) for as long as told by your health care provider.  Do exercises as told by your health care provider. General instructions   Take over-the-counter and prescription medicines only as told by your health care provider.  If you have questions or concerns about safety while taking pain medicine, talk with your health care provider.  Do not drive or operate heavy machinery until you know how your pain medicine affects you.  Do not use any tobacco products, such as cigarettes, chewing tobacco, and e-cigarettes. Tobacco can delay bone healing. If you need help quitting, ask your health care provider.  Keep all follow-up visits as told by your health care provider. This is important. How is this prevented?  Warm up and stretch before being active.  Cool down and stretch after being active.  Give your body time to rest between periods of activity.  Avoid: ? Being physically inactive for long periods at a time. ? Exercising or playing sports when you are tired or in pain.  Use correct form when playing sports and lifting heavy objects.  Use good posture when sitting and standing.  Maintain a healthy weight.  Sleep on a mattress with medium firmness to support your back.  Make sure to use equipment that fits you, including shoes that fit well.  Be safe and responsible while being active to avoid falls.  Do at least 150 minutes of moderate-intensity exercise each week, such as brisk walking or water aerobics. Try a form of exercise that takes stress off your back, such as  swimming or stationary cycling.  Maintain physical fitness, including: ? Strength. ? Flexibility. ? Cardiovascular fitness. ? Endurance. Contact a health care provider if:  Your back pain does not improve after 6 weeks of treatment.  Your symptoms get worse. Get help right away if:  Your back pain is severe.  You are unable to stand or walk.  You develop pain in your legs.  You develop weakness in your buttocks or legs.  You have difficulty controlling when you urinate or when you have a bowel movement. This information is not intended to replace advice given to you by your health care provider. Make sure you discuss any questions you have with your health care provider. Document Released: 08/10/2005 Document Revised: 04/16/2016 Document Reviewed: 05/22/2015 Elsevier Interactive Patient Education  2017 [employer].   IF you received an x-ray today, you will receive an invoice from [facility]. Please contact [facility] at [phone] with questions or concerns regarding your invoice.   IF you received labwork today, you will receive an invoice from [facility]. Please contact [facility] at [phone] with questions or concerns regarding your invoice.   Our billing staff will not be able to assist you with questions regarding bills from these companies.  You will be contacted with  the lab results as soon as they are available. The fastest way to get your results is to activate your My Chart account. Instructions are located on the last page of this paperwork. If you have not heard from [place] regarding the results in 2 weeks, please contact this office.      Signed,   [name], MD [facility].  04/01/18 1:58 PM

## 2018-04-05 ENCOUNTER — Telehealth: Payer: Self-pay | Admitting: Family Medicine

## 2018-04-05 DIAGNOSIS — M545 Low back pain, unspecified: Secondary | ICD-10-CM

## 2018-04-05 DIAGNOSIS — M79605 Pain in left leg: Principal | ICD-10-CM

## 2018-04-05 NOTE — Telephone Encounter (Signed)
Copied from [place] [phone]. Topic: Inquiry >> Apr 04, 2018  3:38 PM Pricilla Handler wrote: Reason for CRM: Mickel Baas from [facility] # [phone], Fax # [phone]) called returning a call from our office. Mickel Baas states that she needs a Request for Therapy faxed to [facility] before they can approve the therapy request. Mickel Baas asks that someone from the office contact her at [phone] ASAP.       Thank You!!!

## 2018-04-06 ENCOUNTER — Telehealth: Payer: Self-pay | Admitting: Family Medicine

## 2018-04-06 NOTE — Telephone Encounter (Signed)
Copied from [place] [id number]. Topic: General - Other >> Apr 06, 2018  2:02 PM Cecelia Byars, NT wrote: Reason for CRM: Patient called and said Manuela Schwartz a physical  therapist from [place] says that she needs paperwork stating he needs pt due to a workers comp claim ,  her cb # is [phone]

## 2018-04-06 NOTE — Telephone Encounter (Signed)
Copied from [place] [phone]. Topic: General - Other >> Apr 06, 2018 10:26 AM Yvette Rack wrote: Reason for CRM: Robin nurse case manager with [employer] requests pt Rx for Physical Therapy. Cb# [phone]  Fax [phone]

## 2018-04-07 ENCOUNTER — Ambulatory Visit: Payer: 59 | Admitting: Physical Therapy

## 2018-04-07 DIAGNOSIS — M545 Low back pain, unspecified: Secondary | ICD-10-CM | POA: Insufficient documentation

## 2018-04-07 DIAGNOSIS — M79605 Pain in left leg: Principal | ICD-10-CM

## 2018-04-07 NOTE — Telephone Encounter (Signed)
Copied from [place] [phone]. Topic: Inquiry >> Apr 04, 2018  3:38 PM Pricilla Handler wrote: Reason for CRM: Mickel Baas from [facility] # [phone], Fax # [phone]) called returning a call from our office. Mickel Baas states that she needs a Request for Therapy faxed to [facility] before they can approve the therapy request. Mickel Baas asks that someone from the office contact her at [phone] ASAP.       Thank You!!! >> Apr 07, 2018  9:21 AM Vernona Rieger wrote: Shirlean Mylar, called and said she needs the script for therapy. She has not heard back and has called 3 times for this patient. Please Advise.   Prescription faxed to [facility] # [phone], Fax # [phone]) Copy to scan for pt's chart.

## 2018-04-07 NOTE — Telephone Encounter (Signed)
Please advise. Dgaddy, CMA 

## 2018-04-08 NOTE — Telephone Encounter (Signed)
Robin nurse case manager called again from with [employer] Request the PT orders again, she has stated that she has not rec;d them.  Please fax too [phone]

## 2018-04-08 NOTE — Telephone Encounter (Signed)
This was completed and given to [facility] yesterday.

## 2018-04-12 NOTE — Telephone Encounter (Addendum)
Robin with [employer] calling back because they need an actual script for PT faxed to them.  They use there own network providers. They need the body part, frequency and duration. Or say "evaluate and treat" They need asap pt has gone more than a week with no treatment.  Shirlean Mylar states she has called 7 times. Please fax to: Fax:  [phone]  Phone [phone]  Ext [phone]

## 2018-04-12 NOTE — Telephone Encounter (Signed)
Shirlean Mylar called and states she has called again to get this PT request faxed. Please see below message. Thank you

## 2018-04-13 ENCOUNTER — Telehealth: Payer: Self-pay | Admitting: Family Medicine

## 2018-04-13 NOTE — Telephone Encounter (Signed)
My understanding was that [facility] completed and faxed this.  Did they not receive paper Rx?  I can complete new one tomorrow in office if needed.

## 2018-04-14 ENCOUNTER — Ambulatory Visit: Payer: 59 | Admitting: Physical Therapy

## 2018-04-14 NOTE — Telephone Encounter (Signed)
I did not see previous prescription in the media tab.  New prescription provided for physical therapy, eval and treat for low back pain, left-sided.   Prescription was placed in fax folder in back office. Please fax to listed number on previous message but also verify after that fax has been sent that they did receive it.  Please place copy for scan in media after that has been verified.  Thank you.

## 2018-04-14 NOTE — Telephone Encounter (Unsigned)
Copied from [place] [id number]. Topic: General - Other >> Apr 13, 2018  2:45 PM Yvette Rack wrote: Reason for CRM: Robin with [employer] stated they did not receive the script for PT . Shirlean Mylar stated the script should be faxed [phone]

## 2018-04-21 NOTE — Telephone Encounter (Signed)
See prior note and resolution.

## 2018-04-21 NOTE — Telephone Encounter (Signed)
IC Mickel Baas at [place] to return call if she has not received this written order for PT- 04/07/2018 the prescription was faxed for PT to the original fax number given to [place].  Message from 04/14/2018 states Robin with [facility] does not have the script.  Another fax number given in notes. It was  refaxed by ?medical records 8/22 to the new number [phone]

## 2018-04-22 ENCOUNTER — Encounter: Payer: Self-pay | Admitting: Family Medicine

## 2018-04-22 ENCOUNTER — Telehealth: Payer: Self-pay | Admitting: Family Medicine

## 2018-04-22 ENCOUNTER — Telehealth: Payer: Self-pay

## 2018-04-22 ENCOUNTER — Other Ambulatory Visit: Payer: Self-pay

## 2018-04-22 ENCOUNTER — Ambulatory Visit (INDEPENDENT_AMBULATORY_CARE_PROVIDER_SITE_OTHER): Payer: Worker's Compensation | Admitting: Family Medicine

## 2018-04-22 DIAGNOSIS — M5442 Lumbago with sciatica, left side: Secondary | ICD-10-CM | POA: Diagnosis not present

## 2018-04-22 MED ORDER — CYCLOBENZAPRINE HCL 5 MG PO TABS: 2.5000 mg | ORAL_TABLET | Freq: Three times a day (TID) | ORAL | 1 refills | Status: DC | PRN

## 2018-04-22 NOTE — Progress Notes (Signed)
Subjective:  By signing my name below, I, Essence Howell, attest that this documentation has been prepared under the direction and in the presence of Wendie Agreste, MD Electronically Signed: Ladene Artist, ED Scribe 04/22/2018 at 1:47 PM.   Patient ID: Luke Franco, male    DOB: 06-18-1956, 62 y.o.   MRN: [id number]  Chief Complaint  Patient presents with  . Work Related Injury    follow-up on lower back pain 04/01/2018   HPI Luke Franco is a 62 y.o. male who presents to Primary Care at [facility] for f/u. Injury that occurred at work. Date of injury on 7/1, initial eval 7/2. Low back strain with L-sided sciatica. Last seen 8/9 still with some low back spasms, less catching sensation with movements. Flexeril 3 times/wk. 45% improvement. Referred to PT. OTC NSAIDs prn and flexeril for spasms, continue restrictions. Required new prescription  for PT. Phone note yesterday, to verify receipt of faxed script for PT. Left message for callback.  Today, pt reports that pain intermittently radiates down L buttock and thigh. Reports that pain is the same as last OV (~45%). He reports some subjective weakness in L leg/trimbling in L leg only with trembling. He has continued Flexeril bid and ibuprofen ~2 tabs/day. He has not been able to return to work due to restrictions. Pt has not attended PT yet; states PT has to go through his case worker. Denies bladder/bowel incontinence, saddle anesthesia, weight loss, night sweats, fever.  Review of Systems  Constitutional: Negative for diaphoresis, fever and unexpected weight change.  Genitourinary: Negative for enuresis.  Musculoskeletal: Positive for back pain.  Neurological: Positive for weakness (subjective). Negative for numbness.      Objective:   Physical Exam  Constitutional: He is oriented to person, place, and time. He appears well-developed and well-nourished. No distress.  HENT:  Head: Normocephalic and atraumatic.  Eyes: Conjunctivae and  EOM are normal.  Neck: Neck supple. No tracheal deviation present.  Cardiovascular: Normal rate.  Pulmonary/Chest: Effort normal. No respiratory distress.  Musculoskeletal:  Slight tenderness to L lower paralumbar spine. No midline bony tenderness. Some tenderness along SI joint to L sciatic notch. Flexion 90, somewhat limited on lateral bilat. More on L lateral flexion than R. Slight guarding with R rotation due to L discomfort. Able to heel and toe walk. Neg seated straight leg raise with pulling sensation only on the L.  Neurological: He is alert and oriented to person, place, and time.  Reflex Scores:      Patellar reflexes are 2+ on the right side and 2+ on the left side.      Achilles reflexes are 2+ on the right side and 2+ on the left side. Skin: Skin is warm and dry.  Psychiatric: He has a normal mood and affect. His behavior is normal.  Nursing note and vitals reviewed.  Vitals:   04/22/18 1340  BP: (!) 142/82  Pulse: 90  Resp: 16  Temp: 98 F (36.7 C)  TempSrc: Oral  SpO2: 96%  Weight: 165 lb 8 oz (75.1 kg)  Height: 5\' 10"  (1.778 m)      Assessment & Plan:    Luke Franco is a 62 y.o. male Acute left-sided low back pain with left-sided sciatica - Plan: cyclobenzaprine (FLEXERIL) 5 MG tablet  -Low back pain/strain with left-sided sciatica symptoms due to injury at work as above.  Symptoms have been stable since previous visit.  Still waiting on physical therapy.  -Start physical therapy,  continue Flexeril if needed, handout given on back pain.  Continue same restrictions for now.  If not improving with physical therapy or unable to have physical therapy, would consider orthopedic evaluation.  RTC precautions if worse  Meds ordered this encounter  Medications  . cyclobenzaprine (FLEXERIL) 5 MG tablet    Sig: Take 0.5-1 tablets (2.5-5 mg total) by mouth 3 (three) times daily as needed for muscle spasms.    Dispense:  20 tablet    Refill:  1   Patient Instructions     I have ordered the physical therapy at prior visit and sent a paper Rx as requested. We did leave a message yesterday at physical therapy to verify receipt.   If unable to undergo physical therapy, I would recommend meeting with orthopaedist. Return to the clinic or go to the nearest emergency room if any of your symptoms worsen or new symptoms occur.  Recheck in 2 weeks (05/05/18).   Back Pain, Adult Many adults have back pain from time to time. Common causes of back pain include:  A strained muscle or ligament.  Wear and tear (degeneration) of the spinal disks.  Arthritis.  A hit to the back.  Back pain can be short-lived (acute) or last a long time (chronic). A physical exam, lab tests, and imaging studies may be done to find the cause of your pain. Follow these instructions at home: Managing pain and stiffness  Take over-the-counter and prescription medicines only as told by your health care provider.  If directed, apply heat to the affected area as often as told by your health care provider. Use the heat source that your health care provider recommends, such as a moist heat pack or a heating pad. ? Place a towel between your skin and the heat source. ? Leave the heat on for 20-30 minutes. ? Remove the heat if your skin turns bright red. This is especially important if you are unable to feel pain, heat, or cold. You have a greater risk of getting burned.  If directed, apply ice to the injured area: ? Put ice in a plastic bag. ? Place a towel between your skin and the bag. ? Leave the ice on for 20 minutes, 2-3 times a day for the first 2-3 days. Activity  Do not stay in bed. Resting more than 1-2 days can delay your recovery.  Take short walks on even surfaces as soon as you are able. Try to increase the length of time you walk each day.  Do not sit, drive, or stand in one place for more than 30 minutes at a time. Sitting or standing for long periods of time can put  stress on your back.  Use proper lifting techniques. When you bend and lift, use positions that put less stress on your back: ? [place] your knees. ? Keep the load close to your body. ? Avoid twisting.  Exercise regularly as told by your health care provider. Exercising will help your back heal faster. This also helps prevent back injuries by keeping muscles strong and flexible.  Your health care provider may recommend that you see a physical therapist. This person can help you come up with a safe exercise program. Do any exercises as told by your physical therapist. Lifestyle  Maintain a healthy weight. Extra weight puts stress on your back and makes it difficult to have good posture.  Avoid activities or situations that make you feel anxious or stressed. Learn ways to manage anxiety and  stress. One way to manage stress is through exercise. Stress and anxiety increase muscle tension and can make back pain worse. General instructions  Sleep on a firm mattress in a comfortable position. Try lying on your side with your knees slightly bent. If you lie on your back, put a pillow under your knees.  Follow your treatment plan as told by your health care provider. This may include: ? Cognitive or behavioral therapy. ? Acupuncture or massage therapy. ? Meditation or yoga. Contact a health care provider if:  You have pain that is not relieved with rest or medicine.  You have increasing pain going down into your legs or buttocks.  Your pain does not improve in 2 weeks.  You have pain at night.  You lose weight.  You have a fever or chills. Get help right away if:  You develop new bowel or bladder control problems.  You have unusual weakness or numbness in your arms or legs.  You develop nausea or vomiting.  You develop abdominal pain.  You feel faint. Summary  Many adults have back pain from time to time. A physical exam, lab tests, and imaging studies may be done to find the  cause of your pain.  Use proper lifting techniques. When you bend and lift, use positions that put less stress on your back.  Take over-the-counter and prescription medicines and apply heat or ice as directed by your health care provider. This information is not intended to replace advice given to you by your health care provider. Make sure you discuss any questions you have with your health care provider. Document Released: 08/10/2005 Document Revised: 09/14/2016 Document Reviewed: 09/14/2016 Elsevier Interactive Patient Education  [employer].   If you have lab work done today you will be contacted with your lab results within the next 2 weeks.  If you have not heard from [place] then please contact us. The fastest way to get your results is to register for My Chart.   IF you received an x-ray today, you will receive an invoice from [facility]. Please contact [facility] at [phone] with questions or concerns regarding your invoice.   IF you received labwork today, you will receive an invoice from [place]. Please contact [facility] at [phone] with questions or concerns regarding your invoice.   Our billing staff will not be able to assist you with questions regarding bills from these companies.  You will be contacted with the lab results as soon as they are available. The fastest way to get your results is to activate your My Chart account. Instructions are located on the last page of this paperwork. If you have not heard from [place] regarding the results in 2 weeks, please contact this office.       I personally performed the services described in this documentation, which was scribed in my presence. The recorded information has been reviewed and considered for accuracy and completeness, addended by me as needed, and agree with information above.  Signed,   [name], MD Primary [facility] at [place] [facility].  04/24/18 8:49 PM

## 2018-04-22 NOTE — Telephone Encounter (Signed)
Copied from [place] [id number]. Topic: Quick Communication - See Telephone Encounter >> Apr 22, 2018  2:41 PM Rutherford Nail, NT wrote: CRM for notification. See Telephone encounter for: 04/22/18. Crystal Amedeo Plenty with [employer] calling and would like for the patient's therapy orders to be faxed to her when they are read. Please advise.  Fax#: [phone] Attn: Baird Kay

## 2018-04-22 NOTE — Patient Instructions (Addendum)
I have ordered the physical therapy at prior visit and sent a paper Rx as requested. We did leave a message yesterday at physical therapy to verify receipt.   If unable to undergo physical therapy, I would recommend meeting with orthopaedist. Return to the clinic or go to the nearest emergency room if any of your symptoms worsen or new symptoms occur.  Recheck in 2 weeks (05/05/18).   Back Pain, Adult Many adults have back pain from time to time. Common causes of back pain include:  A strained muscle or ligament.  Wear and tear (degeneration) of the spinal disks.  Arthritis.  A hit to the back.  Back pain can be short-lived (acute) or last a long time (chronic). A physical exam, lab tests, and imaging studies may be done to find the cause of your pain. Follow these instructions at home: Managing pain and stiffness  Take over-the-counter and prescription medicines only as told by your health care provider.  If directed, apply heat to the affected area as often as told by your health care provider. Use the heat source that your health care provider recommends, such as a moist heat pack or a heating pad. ? Place a towel between your skin and the heat source. ? Leave the heat on for 20-30 minutes. ? Remove the heat if your skin turns bright red. This is especially important if you are unable to feel pain, heat, or cold. You have a greater risk of getting burned.  If directed, apply ice to the injured area: ? Put ice in a plastic bag. ? Place a towel between your skin and the bag. ? Leave the ice on for 20 minutes, 2-3 times a day for the first 2-3 days. Activity  Do not stay in bed. Resting more than 1-2 days can delay your recovery.  Take short walks on even surfaces as soon as you are able. Try to increase the length of time you walk each day.  Do not sit, drive, or stand in one place for more than 30 minutes at a time. Sitting or standing for long periods of time can put stress on  your back.  Use proper lifting techniques. When you bend and lift, use positions that put less stress on your back: ? [place] your knees. ? Keep the load close to your body. ? Avoid twisting.  Exercise regularly as told by your health care provider. Exercising will help your back heal faster. This also helps prevent back injuries by keeping muscles strong and flexible.  Your health care provider may recommend that you see a physical therapist. This person can help you come up with a safe exercise program. Do any exercises as told by your physical therapist. Lifestyle  Maintain a healthy weight. Extra weight puts stress on your back and makes it difficult to have good posture.  Avoid activities or situations that make you feel anxious or stressed. Learn ways to manage anxiety and stress. One way to manage stress is through exercise. Stress and anxiety increase muscle tension and can make back pain worse. General instructions  Sleep on a firm mattress in a comfortable position. Try lying on your side with your knees slightly bent. If you lie on your back, put a pillow under your knees.  Follow your treatment plan as told by your health care provider. This may include: ? Cognitive or behavioral therapy. ? Acupuncture or massage therapy. ? Meditation or yoga. Contact a health care provider if:  You have  pain that is not relieved with rest or medicine.  You have increasing pain going down into your legs or buttocks.  Your pain does not improve in 2 weeks.  You have pain at night.  You lose weight.  You have a fever or chills. Get help right away if:  You develop new bowel or bladder control problems.  You have unusual weakness or numbness in your arms or legs.  You develop nausea or vomiting.  You develop abdominal pain.  You feel faint. Summary  Many adults have back pain from time to time. A physical exam, lab tests, and imaging studies may be done to find the cause of your  pain.  Use proper lifting techniques. When you bend and lift, use positions that put less stress on your back.  Take over-the-counter and prescription medicines and apply heat or ice as directed by your health care provider. This information is not intended to replace advice given to you by your health care provider. Make sure you discuss any questions you have with your health care provider. Document Released: 08/10/2005 Document Revised: 09/14/2016 Document Reviewed: 09/14/2016 Elsevier Interactive Patient Education  [employer].   If you have lab work done today you will be contacted with your lab results within the next 2 weeks.  If you have not heard from [place] then please contact us. The fastest way to get your results is to register for My Chart.   IF you received an x-ray today, you will receive an invoice from [facility]. Please contact [facility] at [phone] with questions or concerns regarding your invoice.   IF you received labwork today, you will receive an invoice from [place]. Please contact [facility] at [phone] with questions or concerns regarding your invoice.   Our billing staff will not be able to assist you with questions regarding bills from these companies.  You will be contacted with the lab results as soon as they are available. The fastest way to get your results is to activate your My Chart account. Instructions are located on the last page of this paperwork. If you have not heard from [place] regarding the results in 2 weeks, please contact this office.

## 2018-04-22 NOTE — Telephone Encounter (Signed)
Called to verify with CM Robin at [facility] and LVM for her to give me a call to confirm that the faxed orders was received.

## 2018-04-26 NOTE — Telephone Encounter (Signed)
Crystal calling again and states she didn't received the PT orders. Please fax again. FAX# [phone]

## 2018-04-26 NOTE — Telephone Encounter (Signed)
Luke Franco with [employer] [facility] called to say she has not yet received the orders for physical therapy. It needs to be faxed to [phone]

## 2018-04-27 NOTE — Telephone Encounter (Signed)
Copied from [place] [phone]. Topic: Quick Communication - See Telephone Encounter >> Apr 27, 2018 12:36 PM Carolyn Stare wrote:  Luke Franco with [facility] said she did not received the fax for physical therapy orders that she requested and need those orders today   [phone]

## 2018-04-28 ENCOUNTER — Telehealth: Payer: Self-pay | Admitting: Family Medicine

## 2018-04-28 NOTE — Telephone Encounter (Signed)
[place], [phone] called for the PT rehab orders to be refaxed.  They were received but they were very dark. [phone]

## 2018-04-28 NOTE — Telephone Encounter (Unsigned)
Copied from [place] [id number]. Topic: General - Other >> Apr 28, 2018  1:48 PM Carolyn Stare wrote:  Solon Augusta with [employer] who handle pt Workers comp call to ask for physical therapy orders to be fax to  [phone]

## 2018-04-28 NOTE — Telephone Encounter (Signed)
Order resent and confirmation fax received at 5:11 pm. Dgaddy, CMA

## 2018-05-03 NOTE — Telephone Encounter (Signed)
Information faxed

## 2018-05-05 ENCOUNTER — Ambulatory Visit (INDEPENDENT_AMBULATORY_CARE_PROVIDER_SITE_OTHER): Payer: Worker's Compensation | Admitting: Family Medicine

## 2018-05-05 ENCOUNTER — Other Ambulatory Visit: Payer: Self-pay

## 2018-05-05 ENCOUNTER — Encounter: Payer: Self-pay | Admitting: Family Medicine

## 2018-05-05 VITALS — BP 140/82 | HR 106 | Temp 98.1°F | Ht 70.0 in | Wt 166.4 lb

## 2018-05-05 DIAGNOSIS — M5442 Lumbago with sciatica, left side: Secondary | ICD-10-CM

## 2018-05-05 NOTE — Progress Notes (Signed)
Subjective:  By signing my name below, I, Luke Franco, attest that this documentation has been prepared under the direction and in the presence of Luke Ray, MD. Electronically Signed: Moises Franco, [place]. 05/05/2018 , 3:32 PM .  Patient was seen in Room 1 .   Patient ID: Luke Franco, male    DOB: 10-31-1955, 62 y.o.   MRN: [id number] Chief Complaint  Patient presents with  . Back Pain    feeling bit better after PT   HPI Luke Franco is a 62 y.o. male  Here for recheck. He is followed for injury which occurred at work, date of injury on July 1st and initial eval on 7/2. Low back strain with left sided sciatica. He was previously referred to physical therapy, but not yet started as of last visit on Aug 30th. He was continued on flexeril bid and ibuprofen approximately 2 tablets per day. He had intermittent pain radiating down to left buttock and thigh at that time, roughly same pain to prior visit. He was roughly 45% improved, and continued on same restrictions while waiting to start physical therapy.   Patient states he had started physical therapy since last visit, with initial visit on Sept 9th. He will be returning to physical therapy on Sept 18th. He had his back massaged which made him really sore. He was instructed to walk 20 minutes daily, as well as 4 different home exercises in the morning and in the evening. He is still taking flexeril bid. He's also still taking ibuprofen 2 tablets, as well as tylenol as he's noticed having some constipation with just taking ibuprofen. He's been taking senokot to help with the constipation. He feels pain toward his buttock but not the thigh recently.   He describes overall pain has improved, believes physical therapy has helped. He estimates roughly 40-50% improvement. He denies any bowel or urinary incontinence, saddle anesthesia, night sweats, fever or unexplained weight loss. He denies weakness in his left leg, although it does  tremble at times; this has improved since he's been walking. He's able to independently walk on this leg with strength without assisted devices.   Prior to Admission medications   Medication Sig Start Date End Date Taking? Authorizing Provider  cetirizine (ZYRTEC) 10 MG tablet Take 10 mg by mouth daily.    [provider]  cyclobenzaprine (FLEXERIL) 5 MG tablet Take 0.5-1 tablets (2.5-5 mg total) by mouth 3 (three) times daily as needed for muscle spasms. 04/22/18   Wendie Agreste, MD  hydrOXYzine (ATARAX/VISTARIL) 10 MG tablet Take 0.5-1 tablets (5-10 mg total) by mouth at bedtime as needed. 02/06/17   Wendie Agreste, MD   Allergies  Allergen Reactions  . Amoxicillin     vomiting  . Prednisone Nausea And Vomiting    Review of Systems  Constitutional: Negative for fatigue and unexpected weight change.  Eyes: Negative for visual disturbance.  Respiratory: Negative for cough, chest tightness and shortness of breath.   Cardiovascular: Negative for chest pain, palpitations and leg swelling.  Gastrointestinal: Negative for abdominal pain and Franco in stool.  Musculoskeletal: Positive for back pain and myalgias.  Neurological: Negative for dizziness, light-headedness and headaches.       Objective:   Physical Exam  Constitutional: He is oriented to person, place, and time. He appears well-developed and well-nourished. No distress.  HENT:  Head: Normocephalic and atraumatic.  Eyes: Pupils are equal, round, and reactive to light. EOM are normal.  Neck: Neck supple.  Cardiovascular: Normal rate.  Pulmonary/Chest: Effort normal. No respiratory distress.  Musculoskeletal: Normal range of motion.  Back: no focal midline bony tenderness, slight tenderness of left paraspinals approximately L3-4, as well as some diffuse tenderness along left paraspinals of lumbar spine; no SI or sciatic notch tenderness, flexion to approximately 80 degrees of lumbar spine, extension intact, slight  decreased right lateral flexion due to catch sensation in lower back, able to heel-toe walk without difficulty, negative seated straight leg raise bilaterally  Neurological: He is alert and oriented to person, place, and time. He displays no Babinski's sign on the right side. He displays no Babinski's sign on the left side.  Reflex Scores:      Patellar reflexes are 2+ on the right side and 2+ on the left side.      Achilles reflexes are 2+ on the right side and 2+ on the left side. Skin: Skin is warm and dry.  Psychiatric: He has a normal mood and affect. His behavior is normal.  Nursing note and vitals reviewed.   Vitals:   05/05/18 1451  BP: 140/82  Pulse: (!) 106  Temp: 98.1 F (36.7 C)  TempSrc: Oral  SpO2: 94%  Weight: 166 lb 6.4 oz (75.5 kg)  Height: 5\' 10"  (1.778 m)       Assessment & Plan:  Luke Franco is a 62 y.o. male Left-sided low back pain with left-sided sciatica, unspecified chronicity  -Slight improvement of left-sided low back pain from initial injury as above at work.  Suspect physical therapy will continue to help.  Has only had one treatment.   - Continue same restrictions with recheck in 2 weeks.  Continue Flexeril, episodic ibuprofen and Tylenol if needed.  RTC precautions if worse.  No orders of the defined types were placed in this encounter.  Patient Instructions   I am glad to hear that you are improving.Continue home exercises as prescribed by physical therapy, and follow up as planned for next treatment.  Follow-up with me in the next 2 weeks to review symptoms at that time as that should allow you to have a few more physical therapy visits.  Okay to continue Flexeril if needed, and occasional tylenol is ok to supplement with ibuprofen short term if needed.  If you have lab work done today you will be contacted with your lab results within the next 2 weeks.  If you have not heard from [place] then please contact us. The fastest way to get your results is  to register for My Chart.   IF you received an x-Franco today, you will receive an invoice from [facility]. Please contact [facility] at [phone] with questions or concerns regarding your invoice.   IF you received labwork today, you will receive an invoice from [place]. Please contact [facility] at [phone] with questions or concerns regarding your invoice.   Our billing staff will not be able to assist you with questions regarding bills from these companies.  You will be contacted with the lab results as soon as they are available. The fastest way to get your results is to activate your My Chart account. Instructions are located on the last page of this paperwork. If you have not heard from [place] regarding the results in 2 weeks, please contact this office.       I personally performed the services described in this documentation, which was scribed in my presence. The recorded information has been reviewed and considered for accuracy and completeness, addended  by me as needed, and agree with information above.  Signed,   Luke Ray, MD Primary Care at [place].  05/05/18 5:38 PM

## 2018-05-05 NOTE — Patient Instructions (Addendum)
I am glad to hear that you are improving.Continue home exercises as prescribed by physical therapy, and follow up as planned for next treatment.  Follow-up with me in the next 2 weeks to review symptoms at that time as that should allow you to have a few more physical therapy visits.  Okay to continue Flexeril if needed, and occasional tylenol is ok to supplement with ibuprofen short term if needed.  If you have lab work done today you will be contacted with your lab results within the next 2 weeks.  If you have not heard from [place] then please contact us. The fastest way to get your results is to register for My Chart.   IF you received an x-ray today, you will receive an invoice from [facility]. Please contact [facility] at [phone] with questions or concerns regarding your invoice.   IF you received labwork today, you will receive an invoice from [place]. Please contact [facility] at [phone] with questions or concerns regarding your invoice.   Our billing staff will not be able to assist you with questions regarding bills from these companies.  You will be contacted with the lab results as soon as they are available. The fastest way to get your results is to activate your My Chart account. Instructions are located on the last page of this paperwork. If you have not heard from [place] regarding the results in 2 weeks, please contact this office.

## 2018-05-06 ENCOUNTER — Telehealth: Payer: Self-pay | Admitting: Family Medicine

## 2018-05-06 NOTE — Telephone Encounter (Signed)
Copied from [place] [id number]. Topic: General - Other >> May 06, 2018  2:30 PM Keene Breath wrote: Reason for CRM: Robin from [facility] called to request notes from patient's recent visit 05/05/18.  Please advise.  CB# [id number], ext. [phone], Fax # [phone].

## 2018-05-07 ENCOUNTER — Ambulatory Visit: Payer: Self-pay | Admitting: Family Medicine

## 2018-05-10 NOTE — Telephone Encounter (Signed)
Notes faxed.

## 2018-05-10 NOTE — Telephone Encounter (Signed)
These have been faxed several times. The pt was in the office the other day and he has a copy of the order.   He stated that he will bring to his PT office if they should ask for it.

## 2018-05-24 ENCOUNTER — Other Ambulatory Visit: Payer: Self-pay

## 2018-05-24 ENCOUNTER — Encounter: Payer: Self-pay | Admitting: Family Medicine

## 2018-05-24 ENCOUNTER — Ambulatory Visit (INDEPENDENT_AMBULATORY_CARE_PROVIDER_SITE_OTHER): Payer: Worker's Compensation | Admitting: Family Medicine

## 2018-05-24 VITALS — BP 132/70 | HR 98 | Temp 98.3°F | Ht 70.0 in | Wt 171.8 lb

## 2018-05-24 DIAGNOSIS — M5442 Lumbago with sciatica, left side: Secondary | ICD-10-CM

## 2018-05-24 MED ORDER — CYCLOBENZAPRINE HCL 5 MG PO TABS: 2.5000 mg | ORAL_TABLET | Freq: Three times a day (TID) | ORAL | 1 refills | Status: DC | PRN

## 2018-05-24 NOTE — Patient Instructions (Addendum)
Ok to continue physical therapy. Can try flexeril at bedtime instead of in the morning. Tylenol during the day as needed. If pain not continuing to improve, would recommend meeting with back specialist.   Return to the clinic or go to the nearest emergency room if any of your symptoms worsen or new symptoms occur.  Recheck in 2 weeks. Return to the clinic or go to the nearest emergency room if any of your symptoms worsen or new symptoms occur.    Back Pain, Adult Many adults have back pain from time to time. Common causes of back pain include:  A strained muscle or ligament.  Wear and tear (degeneration) of the spinal disks.  Arthritis.  A hit to the back.  Back pain can be short-lived (acute) or last a long time (chronic). A physical exam, lab tests, and imaging studies may be done to find the cause of your pain. Follow these instructions at home: Managing pain and stiffness  Take over-the-counter and prescription medicines only as told by your health care provider.  If directed, apply heat to the affected area as often as told by your health care provider. Use the heat source that your health care provider recommends, such as a moist heat pack or a heating pad. ? Place a towel between your skin and the heat source. ? Leave the heat on for 20-30 minutes. ? Remove the heat if your skin turns bright red. This is especially important if you are unable to feel pain, heat, or cold. You have a greater risk of getting burned.  If directed, apply ice to the injured area: ? Put ice in a plastic bag. ? Place a towel between your skin and the bag. ? Leave the ice on for 20 minutes, 2-3 times a day for the first 2-3 days. Activity  Do not stay in bed. Resting more than 1-2 days can delay your recovery.  Take short walks on even surfaces as soon as you are able. Try to increase the length of time you walk each day.  Do not sit, drive, or stand in one place for more than 30 minutes at a  time. Sitting or standing for long periods of time can put stress on your back.  Use proper lifting techniques. When you bend and lift, use positions that put less stress on your back: ? [place] your knees. ? Keep the load close to your body. ? Avoid twisting.  Exercise regularly as told by your health care provider. Exercising will help your back heal faster. This also helps prevent back injuries by keeping muscles strong and flexible.  Your health care provider may recommend that you see a physical therapist. This person can help you come up with a safe exercise program. Do any exercises as told by your physical therapist. Lifestyle  Maintain a healthy weight. Extra weight puts stress on your back and makes it difficult to have good posture.  Avoid activities or situations that make you feel anxious or stressed. Learn ways to manage anxiety and stress. One way to manage stress is through exercise. Stress and anxiety increase muscle tension and can make back pain worse. General instructions  Sleep on a firm mattress in a comfortable position. Try lying on your side with your knees slightly bent. If you lie on your back, put a pillow under your knees.  Follow your treatment plan as told by your health care provider. This may include: ? Cognitive or behavioral therapy. ? Acupuncture or massage  therapy. ? Meditation or yoga. Contact a health care provider if:  You have pain that is not relieved with rest or medicine.  You have increasing pain going down into your legs or buttocks.  Your pain does not improve in 2 weeks.  You have pain at night.  You lose weight.  You have a fever or chills. Get help right away if:  You develop new bowel or bladder control problems.  You have unusual weakness or numbness in your arms or legs.  You develop nausea or vomiting.  You develop abdominal pain.  You feel faint. Summary  Many adults have back pain from time to time. A physical exam,  lab tests, and imaging studies may be done to find the cause of your pain.  Use proper lifting techniques. When you bend and lift, use positions that put less stress on your back.  Take over-the-counter and prescription medicines and apply heat or ice as directed by your health care provider. This information is not intended to replace advice given to you by your health care provider. Make sure you discuss any questions you have with your health care provider. Document Released: 08/10/2005 Document Revised: 09/14/2016 Document Reviewed: 09/14/2016 Elsevier Interactive Patient Education  [employer].   If you have lab work done today you will be contacted with your lab results within the next 2 weeks.  If you have not heard from [place] then please contact us. The fastest way to get your results is to register for My Chart.   IF you received an x-ray today, you will receive an invoice from [facility]. Please contact [facility] at [phone] with questions or concerns regarding your invoice.   IF you received labwork today, you will receive an invoice from [place]. Please contact [facility] at [phone] with questions or concerns regarding your invoice.   Our billing staff will not be able to assist you with questions regarding bills from these companies.  You will be contacted with the lab results as soon as they are available. The fastest way to get your results is to activate your My Chart account. Instructions are located on the last page of this paperwork. If you have not heard from [place] regarding the results in 2 weeks, please contact this office.

## 2018-05-24 NOTE — Progress Notes (Signed)
Subjective:  By signing my name below, I, Luke Franco, attest that this documentation has been prepared under the direction and in the presence of [name], MD. Electronically Signed: Moises Franco, [place]. 05/24/2018 , 4:28 PM .  Patient was seen in Room 8 .   Patient ID: Luke Franco, male    DOB: [birth year], 62 y.o.   MRN: [id number] Chief Complaint  Patient presents with  . WC back pain    f/u   . Medication Refill    felexeril   HPI Luke Franco is a 62 y.o. male  Here for follow up for injury which occurred at work. Date of injury on July 1st, and initial evaluation on July 2nd, low back strain with left sided sciatica. He was treated with ibuprofen, flexeril and physical therapy. His sciatica symptoms improved but continued with some back pain. When he was seen on Sept 12th, he estimately roughly improved by 40-50%. He mentioned having occasional left leg trembling but no weakness. He had reported improved symptoms since walking. He had only 1 treatment with physical therapy at last visit. He was continued on same restrictions; continued on flexeril, episodic ibuprofen and tylenol if needed.   Patient states he has physical therapy tomorrow, going twice a week. He's been having shock treatment therapy, and doing well on it. He dropped something yesterday at home, and immediately bent over to reach for it, and his back pain caught him again. He describes being at 50% improved today, and a little better compared to last visit. He's been taking Flexeril once in the morning feeling a little sleepy. He's been walking and doing exercises. He's taken Flexeril sometimes at night. He's cut back on ibuprofen due to constipation, so he's been taking tylenol 2 tablets a day. He denies any urinary or bowel incontinence, saddle anesthesia, or further trembling.  . Allergies  Allergen Reactions  . Amoxicillin     vomiting  . Prednisone Nausea And Vomiting   Prior to Admission  medications   Medication Sig Start Date End Date Taking? Authorizing Provider  cetirizine (ZYRTEC) 10 MG tablet Take 10 mg by mouth daily.    [provider]  cyclobenzaprine (FLEXERIL) 5 MG tablet Take 0.5-1 tablets (2.5-5 mg total) by mouth 3 (three) times daily as needed for muscle spasms. 04/22/18   Wendie Agreste, MD  hydrOXYzine (ATARAX/VISTARIL) 10 MG tablet Take 0.5-1 tablets (5-10 mg total) by mouth at bedtime as needed. 02/06/17   Wendie Agreste, MD     Review of Systems  Constitutional: Negative for appetite change, chills, fatigue, fever and unexpected weight change.  Eyes: Negative for visual disturbance.  Respiratory: Negative for cough, chest tightness and shortness of breath.   Cardiovascular: Negative for chest pain, palpitations and leg swelling.  Gastrointestinal: Negative for abdominal pain and Franco in stool.  Musculoskeletal: Positive for back pain.  Neurological: Negative for dizziness, tremors, light-headedness and headaches.       Objective:   Physical Exam  Constitutional: He is oriented to person, place, and time. He appears well-developed and well-nourished. No distress.  HENT:  Head: Normocephalic and atraumatic.  Eyes: Pupils are equal, round, and reactive to light. EOM are normal.  Neck: Neck supple.  Cardiovascular: Normal rate.  Pulmonary/Chest: Effort normal. No respiratory distress.  Musculoskeletal: Normal range of motion.  Back: slight tenderness with paraspinal spasms L>R, tender along left paraspinals into upper buttock area, flexion to about 80 degrees, equal rotation and lateral flexion, able to  heel-toe walk, negative seated straight leg raise  Neurological: He is alert and oriented to person, place, and time.  Reflex Scores:      Patellar reflexes are 2+ on the right side and 2+ on the left side.      Achilles reflexes are 2+ on the right side and 2+ on the left side. Skin: Skin is warm and dry.  Psychiatric: He has a normal  mood and affect. His behavior is normal.  Nursing note and vitals reviewed.   Vitals:   05/24/18 1548 05/24/18 1550  BP: (!) 146/73 132/70  Pulse: 98   Temp: 98.3 F (36.8 C)   TempSrc: Oral   SpO2: 94%   Weight: 171 lb 12.8 oz (77.9 kg)   Height: 5\' 10"  (1.778 m)        Assessment & Plan:    Luke Franco is a 62 y.o. male Left-sided low back pain with left-sided sciatica, unspecified chronicity  Acute left-sided low back pain with left-sided sciatica - Plan: cyclobenzaprine (FLEXERIL) 5 MG tablet Low back pain/strain with sciatica symptoms initially injured at work as above.  Did report improvement of symptoms, slight flare after reaching to pick up object from floor last week but has since improved with physical therapy.  -Continue physical therapy, Flexeril if needed, including changing to bedtime if that is less sedating.    -Continue same restrictions for now with repeat evaluation in 2 weeks.   -  If there is a plateau in improvement, or any worsening of symptoms would recommend evaluation with Ortho at that time to decide on advanced imaging or change in plan.   Meds ordered this encounter  Medications  . cyclobenzaprine (FLEXERIL) 5 MG tablet    Sig: Take 0.5-1 tablets (2.5-5 mg total) by mouth 3 (three) times daily as needed for muscle spasms.    Dispense:  20 tablet    Refill:  1   Patient Instructions   Ok to continue physical therapy. Can try flexeril at bedtime instead of in the morning. Tylenol during the day as needed. If pain not continuing to improve, would recommend meeting with back specialist.   Return to the clinic or go to the nearest emergency room if any of your symptoms worsen or new symptoms occur.  Recheck in 2 weeks. Return to the clinic or go to the nearest emergency room if any of your symptoms worsen or new symptoms occur.    Back Pain, Adult Many adults have back pain from time to time. Common causes of back pain include:  A  strained muscle or ligament.  Wear and tear (degeneration) of the spinal disks.  Arthritis.  A hit to the back.  Back pain can be short-lived (acute) or last a long time (chronic). A physical exam, lab tests, and imaging studies may be done to find the cause of your pain. Follow these instructions at home: Managing pain and stiffness  Take over-the-counter and prescription medicines only as told by your health care provider.  If directed, apply heat to the affected area as often as told by your health care provider. Use the heat source that your health care provider recommends, such as a moist heat pack or a heating pad. ? Place a towel between your skin and the heat source. ? Leave the heat on for 20-30 minutes. ? Remove the heat if your skin turns bright red. This is especially important if you are unable to feel pain, heat, or cold. You have a  greater risk of getting burned.  If directed, apply ice to the injured area: ? Put ice in a plastic bag. ? Place a towel between your skin and the bag. ? Leave the ice on for 20 minutes, 2-3 times a day for the first 2-3 days. Activity  Do not stay in bed. Resting more than 1-2 days can delay your recovery.  Take short walks on even surfaces as soon as you are able. Try to increase the length of time you walk each day.  Do not sit, drive, or stand in one place for more than 30 minutes at a time. Sitting or standing for long periods of time can put stress on your back.  Use proper lifting techniques. When you bend and lift, use positions that put less stress on your back: ? [place] your knees. ? Keep the load close to your body. ? Avoid twisting.  Exercise regularly as told by your health care provider. Exercising will help your back heal faster. This also helps prevent back injuries by keeping muscles strong and flexible.  Your health care provider may recommend that you see a physical therapist. This person can help you come up with a  safe exercise program. Do any exercises as told by your physical therapist. Lifestyle  Maintain a healthy weight. Extra weight puts stress on your back and makes it difficult to have good posture.  Avoid activities or situations that make you feel anxious or stressed. Learn ways to manage anxiety and stress. One way to manage stress is through exercise. Stress and anxiety increase muscle tension and can make back pain worse. General instructions  Sleep on a firm mattress in a comfortable position. Try lying on your side with your knees slightly bent. If you lie on your back, put a pillow under your knees.  Follow your treatment plan as told by your health care provider. This may include: ? Cognitive or behavioral therapy. ? Acupuncture or massage therapy. ? Meditation or yoga. Contact a health care provider if:  You have pain that is not relieved with rest or medicine.  You have increasing pain going down into your legs or buttocks.  Your pain does not improve in 2 weeks.  You have pain at night.  You lose weight.  You have a fever or chills. Get help right away if:  You develop new bowel or bladder control problems.  You have unusual weakness or numbness in your arms or legs.  You develop nausea or vomiting.  You develop abdominal pain.  You feel faint. Summary  Many adults have back pain from time to time. A physical exam, lab tests, and imaging studies may be done to find the cause of your pain.  Use proper lifting techniques. When you bend and lift, use positions that put less stress on your back.  Take over-the-counter and prescription medicines and apply heat or ice as directed by your health care provider. This information is not intended to replace advice given to you by your health care provider. Make sure you discuss any questions you have with your health care provider. Document Released: 08/10/2005 Document Revised: 09/14/2016 Document Reviewed:  09/14/2016 Elsevier Interactive Patient Education  [employer].   If you have lab work done today you will be contacted with your lab results within the next 2 weeks.  If you have not heard from [place] then please contact us. The fastest way to get your results is to register for My Chart.  IF you received an x-ray today, you will receive an invoice from [facility]. Please contact [facility] at [phone] with questions or concerns regarding your invoice.   IF you received labwork today, you will receive an invoice from [place]. Please contact [facility] at [phone] with questions or concerns regarding your invoice.   Our billing staff will not be able to assist you with questions regarding bills from these companies.  You will be contacted with the lab results as soon as they are available. The fastest way to get your results is to activate your My Chart account. Instructions are located on the last page of this paperwork. If you have not heard from [place] regarding the results in 2 weeks, please contact this office.       I personally performed the services described in this documentation, which was scribed in my presence. The recorded information has been reviewed and considered for accuracy and completeness, addended by me as needed, and agree with information above.  Signed,   [name], MD [facility].  05/24/18 9:50 PM

## 2018-06-16 ENCOUNTER — Encounter: Payer: Self-pay | Admitting: Family Medicine

## 2018-06-16 ENCOUNTER — Other Ambulatory Visit: Payer: Self-pay

## 2018-06-16 ENCOUNTER — Ambulatory Visit (INDEPENDENT_AMBULATORY_CARE_PROVIDER_SITE_OTHER): Payer: Worker's Compensation | Admitting: Family Medicine

## 2018-06-16 DIAGNOSIS — M5442 Lumbago with sciatica, left side: Secondary | ICD-10-CM

## 2018-06-16 MED ORDER — CYCLOBENZAPRINE HCL 5 MG PO TABS: 2.5000 mg | ORAL_TABLET | Freq: Three times a day (TID) | ORAL | 1 refills | Status: DC | PRN

## 2018-06-16 NOTE — Progress Notes (Signed)
Subjective:  By signing my name below, I, Luke Franco, attest that this documentation has been prepared under the direction and in the presence of [name], MD. Electronically Signed: Moises Franco, [place]. 06/16/2018 , 4:09 PM .  Patient was seen in Room 2 .   Patient ID: Luke PUSTEJOVSKY, male    DOB: 12-07-1955, 62 y.o.   MRN: [id number] Chief Complaint  Patient presents with  . Worker comp    f/u   . cant sleep  . Medication Refill    flexeril   HPI Luke Franco is a 62 y.o. male  Worker's Comp Here for follow up of injury that occurred at work, date of injury on July 1st. Initial evaluation on July 2nd, low back strain with left sided sciatica. He was treated with ibuprofen, flexeril and physical therapy. He had some improvement with physical therapy though still having residual back pain. He had been attending PT when followed up in Oct 1st, but was about 50% last visit as slight flare day prior after bending down to reach for an object. He was walking and doing exercises. He was taking tylenol bid, cutting back on ibuprofen due to constipation, and still using flexeril. He was to continue PT, recommended flexeril at bed time to be less sedating, continue with work restrictions.   On review of note from his physical therapy from 06/09/18, he had not met the long term functional goals, and has partially met his short term goals.   Today Patient states he's taken morning dose of flexeril 2-3 times a week, and still taking night time flexeril almost every night. He's also been taking tylenol 2-3 times in a week. He denies taking any ibuprofen since last visit due to abdominal problems. He rates his pain level approximately 2/10. He mentions overdoing it on a machine and injured his left shoulder, though improving after physical therapy. His case manager at work sent in a referral for him to follow up with Dr. Onnie Graham for his shoulder; appointment on Oct 28th. He notes his  improvement has increased to 70% today, believes requiring a few more PT to get his back stronger. He denies any more pain radiating down his legs. He's been doing activities around the house with raking and riding his lawn mower; heaviest object pick up is around 8-10 lbs at home. He's gone to PT a total of 2-3 times now. He notes most improvement with PT with TENS unit.   On triage, he's been having problems sleeping at night due to lack of work. He used to tire himself out with work. Though, night time flexeril has been helping.   He was present with Anselm Lis, case manager, in office visit today.   Allergies  Allergen Reactions  . Amoxicillin     vomiting  . Prednisone Nausea And Vomiting   Prior to Admission medications   Medication Sig Start Date End Date Taking? Authorizing Provider  cetirizine (ZYRTEC) 10 MG tablet Take 10 mg by mouth daily.    [provider]  cyclobenzaprine (FLEXERIL) 5 MG tablet Take 0.5-1 tablets (2.5-5 mg total) by mouth 3 (three) times daily as needed for muscle spasms. 05/24/18   Wendie Agreste, MD  hydrOXYzine (ATARAX/VISTARIL) 10 MG tablet Take 0.5-1 tablets (5-10 mg total) by mouth at bedtime as needed. 02/06/17   Wendie Agreste, MD   Review of Systems  Constitutional: Negative for fatigue and unexpected weight change.  Eyes: Negative for visual disturbance.  Respiratory:  Negative for cough, chest tightness and shortness of breath.   Cardiovascular: Negative for chest pain, palpitations and leg swelling.  Gastrointestinal: Negative for abdominal pain and Franco in stool.  Musculoskeletal: Positive for back pain. Negative for arthralgias, myalgias and neck pain.  Skin: Negative for rash.  Neurological: Negative for dizziness, weakness, light-headedness and headaches.       Objective:   Physical Exam  Constitutional: He is oriented to person, place, and time. He appears well-developed and well-nourished. No distress.  HENT:    Head: Normocephalic and atraumatic.  Eyes: Pupils are equal, round, and reactive to light. EOM are normal.  Neck: Neck supple.  Cardiovascular: Normal rate.  Pulmonary/Chest: Effort normal. No respiratory distress.  Musculoskeletal: Normal range of motion.  Back: pointing pain to the mid lower L-spine, tender along left paraspinals greater than tenderness over mid L-spine, skin intact, no rash; SI joints non tender, sciatic notch non tender on the left, approximately 80 degrees of flexion, some discomfort with left lateral flexion but grossly equal lateral flexions and grossly equal rotations; able to heel-toe walk without difficulty, negative seated straight leg raise bilaterally  Neurological: He is alert and oriented to person, place, and time.  Reflex Scores:      Patellar reflexes are 2+ on the right side and 2+ on the left side.      Achilles reflexes are 2+ on the right side and 2+ on the left side. Skin: Skin is warm and dry.  Psychiatric: He has a normal mood and affect. His behavior is normal.  Nursing note and vitals reviewed.   Vitals:   06/16/18 1521 06/16/18 1524  BP: (!) 164/95 (!) 148/80  Pulse: 93   Temp: 98 F (36.7 C)   TempSrc: Oral   SpO2: 94%   Weight: 174 lb 12.8 oz (79.3 kg)   Height: 5' 10"  (1.778 m)        Assessment & Plan:   Luke Franco is a 62 y.o. male Acute left-sided low back pain with left-sided sciatica - Plan: cyclobenzaprine (FLEXERIL) 5 MG tablet  -Primarily left-sided low back pain due to injury at work as above.  He has shown some improvement with physical therapy since last visit, now 70% improved versus 50% last visit.    - Decided to continue current restrictions, extend physical therapy for 3 more weeks at 2-3 times per wk   - home TENS unit may be helpful, paper prescription provided.  -Okay to continue Flexeril if needed, plan for decreasing use as symptoms improve.  Tylenol over-the-counter if needed.  -Note provided for work  with restrictions, recheck in 3 weeks.  Should have better idea of MMI timing at that visit or referral to Ortho if plateau of symptoms.  RTC precautions if worse sooner.  Meds ordered this encounter  Medications  . cyclobenzaprine (FLEXERIL) 5 MG tablet    Sig: Take 0.5-1 tablets (2.5-5 mg total) by mouth 3 (three) times daily as needed for muscle spasms.    Dispense:  20 tablet    Refill:  1   Patient Instructions    I am glad to hear that the back pain is improving.  Continue physical therapy 2-3 times per week for the next 3 weeks and return for repeat assessment at that time.  I will also provide a prescription for a home TENS unit as that has been helpful during PT.  Okay to continue Flexeril up to twice per day, but ideally try to use that more  at bedtime due to sedation.  See list of restrictions and do not exceed those on activities around the house at this time.  I expect we can advance restrictions next visit as symptoms improve.  Please follow-up with primary care provider if you are having difficulty with sleep or other concerns that are not related to current injury.  Thank you for coming in today.  Return to the clinic or go to the nearest emergency room if any of your symptoms worsen or new symptoms occur.  If you have lab work done today you will be contacted with your lab results within the next 2 weeks.  If you have not heard from [place] then please contact us. The fastest way to get your results is to register for My Chart.   IF you received an x-ray today, you will receive an invoice from [facility]. Please contact [facility] at [phone] with questions or concerns regarding your invoice.   IF you received labwork today, you will receive an invoice from [place]. Please contact [facility] at [phone] with questions or concerns regarding your invoice.   Our billing staff will not be able to assist you with questions regarding bills from these  companies.  You will be contacted with the lab results as soon as they are available. The fastest way to get your results is to activate your My Chart account. Instructions are located on the last page of this paperwork. If you have not heard from [place] regarding the results in 2 weeks, please contact this office.       I personally performed the services described in this documentation, which was scribed in my presence. The recorded information has been reviewed and considered for accuracy and completeness, addended by me as needed, and agree with information above.  Signed,   [name], MD [facility] [place].  06/16/18 6:51 PM

## 2018-06-16 NOTE — Patient Instructions (Addendum)
  I am glad to hear that the back pain is improving.  Continue physical therapy 2-3 times per week for the next 3 weeks and return for repeat assessment at that time.  I will also provide a prescription for a home TENS unit as that has been helpful during PT.  Okay to continue Flexeril up to twice per day, but ideally try to use that more at bedtime due to sedation.  See list of restrictions and do not exceed those on activities around the house at this time.  I expect we can advance restrictions next visit as symptoms improve.  Please follow-up with primary care provider if you are having difficulty with sleep or other concerns that are not related to current injury.  Thank you for coming in today.  Return to the clinic or go to the nearest emergency room if any of your symptoms worsen or new symptoms occur.  If you have lab work done today you will be contacted with your lab results within the next 2 weeks.  If you have not heard from [place] then please contact us. The fastest way to get your results is to register for My Chart.   IF you received an x-ray today, you will receive an invoice from [facility]. Please contact [facility] at [phone] with questions or concerns regarding your invoice.   IF you received labwork today, you will receive an invoice from [place]. Please contact [facility] at [phone] with questions or concerns regarding your invoice.   Our billing staff will not be able to assist you with questions regarding bills from these companies.  You will be contacted with the lab results as soon as they are available. The fastest way to get your results is to activate your My Chart account. Instructions are located on the last page of this paperwork. If you have not heard from [place] regarding the results in 2 weeks, please contact this office.

## 2018-07-07 ENCOUNTER — Other Ambulatory Visit: Payer: Self-pay

## 2018-07-07 ENCOUNTER — Encounter: Payer: Self-pay | Admitting: Family Medicine

## 2018-07-07 ENCOUNTER — Ambulatory Visit (INDEPENDENT_AMBULATORY_CARE_PROVIDER_SITE_OTHER): Payer: Worker's Compensation | Admitting: Family Medicine

## 2018-07-07 VITALS — BP 140/80 | HR 86 | Temp 98.0°F | Ht 70.0 in | Wt 177.8 lb

## 2018-07-07 DIAGNOSIS — M5442 Lumbago with sciatica, left side: Secondary | ICD-10-CM | POA: Diagnosis not present

## 2018-07-07 NOTE — Patient Instructions (Addendum)
Restrictions were advanced slightly, continue physical therapy, home TENS unit as needed, Tylenol as needed and decrease Flexeril as tolerated.  Tylenol can be used in place of Flexeril if it is just as effective.  Follow-up in 1 month.  Return to the clinic or go to the nearest emergency room if any of your symptoms worsen or new symptoms occur.   If you have lab work done today you will be contacted with your lab results within the next 2 weeks.  If you have not heard from [place] then please contact us. The fastest way to get your results is to register for My Chart.   IF you received an x-ray today, you will receive an invoice from [facility]. Please contact [facility] at [phone] with questions or concerns regarding your invoice.   IF you received labwork today, you will receive an invoice from [place]. Please contact [facility] at [phone] with questions or concerns regarding your invoice.   Our billing staff will not be able to assist you with questions regarding bills from these companies.  You will be contacted with the lab results as soon as they are available. The fastest way to get your results is to activate your My Chart account. Instructions are located on the last page of this paperwork. If you have not heard from [place] regarding the results in 2 weeks, please contact this office.

## 2018-07-07 NOTE — Progress Notes (Signed)
Subjective:  By signing my name below, I, Luke Franco, attest that this documentation has been prepared under the direction and in the presence of Luke Ray, MD. Electronically Signed: Moises Franco, [place]. 07/07/2018 , 4:23 PM .  Patient was seen in Room 9 .   Patient ID: Luke Franco, male    DOB: 02-Nov-1955, 62 y.o.   MRN: [id number] Chief Complaint  Patient presents with  . W/C- back pain   HPI Luke Franco is a 62 y.o. male  Worker's Comp Date of Injury: July 1st Initial evaluation: July 2nd  Patient is here for follow up of worker's comp, injury which occurred at work. See previous notes. Patient had acute low back pain with left sided sciatica. He was treated with NSAID, Flexeril and physical therapy. Last visit was on Oct 24th, reporting 70% improvement at that time, but thought continued PT to be beneficial for further improvement. He had improvement with TENS unit so prescription for home TENS unit was given. He was continued on work restrictions. He was continued on Flexeril as needed, but did discuss to use Flexeril at bed time.   Today Patient states he's had 3 appointments with PT. He's been using home TENS unit twice at home now with improvement. He's up to 35 lbs with turns and pulls at PT; doing well on it only with back soreness. He denies any urinary/bowel incontinence, or pain radiating down his legs. He reports feeling a little bit better compared to last visit. He describes feeling around 70% improved today. He plans on doing 2 PT appointments a week and use the home TENS unit if feeling sore afterwards. He talked to his superintendent recently, and was instructed to return after he's reached 90% improvement.   Patient is only taking Flexeril at night now, about 3-4 times a week. He's also taking tylenol here and there, about twice a week for relief.   Allergies  Allergen Reactions  . Amoxicillin     vomiting  . Prednisone Nausea And Vomiting    Prior to Admission medications   Medication Sig Start Date End Date Taking? Authorizing Provider  cetirizine (ZYRTEC) 10 MG tablet Take 10 mg by mouth daily.    [provider]  cyclobenzaprine (FLEXERIL) 5 MG tablet Take 0.5-1 tablets (2.5-5 mg total) by mouth 3 (three) times daily as needed for muscle spasms. 06/16/18   Wendie Agreste, MD  hydrOXYzine (ATARAX/VISTARIL) 10 MG tablet Take 0.5-1 tablets (5-10 mg total) by mouth at bedtime as needed. 02/06/17   Wendie Agreste, MD    Review of Systems  Constitutional: Negative for fatigue and unexpected weight change.  Eyes: Negative for visual disturbance.  Respiratory: Negative for cough, chest tightness and shortness of breath.   Cardiovascular: Negative for chest pain, palpitations and leg swelling.  Gastrointestinal: Negative for abdominal pain and Franco in stool.  Musculoskeletal: Positive for back pain.  Neurological: Negative for dizziness, light-headedness and headaches.       Objective:   Physical Exam  Constitutional: He is oriented to person, place, and time. He appears well-developed and well-nourished. No distress.  HENT:  Head: Normocephalic and atraumatic.  Eyes: Pupils are equal, round, and reactive to light. EOM are normal.  Neck: Neck supple.  Cardiovascular: Normal rate.  Pulmonary/Chest: Effort normal. No respiratory distress.  Musculoskeletal: Normal range of motion.  Slight tenderness at midline L3-L4, flexion to approximately 80 degrees, slight decreased left lateral flexion, able to heel and toe walk without difficulty,  negative seated straight leg raise  Neurological: He is alert and oriented to person, place, and time.  Skin: Skin is warm and dry.  Psychiatric: He has a normal mood and affect. His behavior is normal.  Nursing note and vitals reviewed.   Vitals:   07/07/18 1535  BP: (!) 157/82  Pulse: 86  Temp: 98 F (36.7 C)  TempSrc: Oral  SpO2: 97%  Weight: 177 lb 12.8 oz (80.6  kg)  Height: 5\' 10"  (1.778 m)       Assessment & Plan:  YI HAUGAN is a 62 y.o. male Left-sided low back pain with left-sided sciatica, unspecified chronicity  Injury at work as above.  Has been improving with physical therapy, as well as episodic use of TENS unit.  -We will advance restrictions slightly, continue PT, Tylenol and Flexeril as needed, but should be tapering back on use of Flexeril.  Recheck in 1 month, anticipate continued improvement at that time.  If plateaus, refer to Ortho.  No orders of the defined types were placed in this encounter.  Patient Instructions   Restrictions were advanced slightly, continue physical therapy, home TENS unit as needed, Tylenol as needed and decrease Flexeril as tolerated.  Tylenol can be used in place of Flexeril if it is just as effective.  Follow-up in 1 month.  Return to the clinic or go to the nearest emergency room if any of your symptoms worsen or new symptoms occur.   If you have lab work done today you will be contacted with your lab results within the next 2 weeks.  If you have not heard from [place] then please contact us. The fastest way to get your results is to register for My Chart.   IF you received an x-Franco today, you will receive an invoice from [facility]. Please contact [facility] at [phone] with questions or concerns regarding your invoice.   IF you received labwork today, you will receive an invoice from [place]. Please contact [facility] at [phone] with questions or concerns regarding your invoice.   Our billing staff will not be able to assist you with questions regarding bills from these companies.  You will be contacted with the lab results as soon as they are available. The fastest way to get your results is to activate your My Chart account. Instructions are located on the last page of this paperwork. If you have not heard from [place] regarding the results in 2 weeks, please contact this  office.       I personally performed the services described in this documentation, which was scribed in my presence. The recorded information has been reviewed and considered for accuracy and completeness, addended by me as needed, and agree with information above.  Signed,   Luke Ray, MD [facility] [place].  07/12/18 8:48 AM

## 2018-08-08 ENCOUNTER — Ambulatory Visit: Payer: Self-pay | Admitting: Family Medicine

## 2018-08-10 ENCOUNTER — Telehealth: Payer: Self-pay | Admitting: Family Medicine

## 2018-08-10 NOTE — Telephone Encounter (Signed)
[employer] has to provider a signed release from the patient for these records--we cannot release them without this.  Will fax over a letterhead letting them know.

## 2018-08-10 NOTE — Telephone Encounter (Signed)
Copied from [place] [phone]. Topic: General - Other >> Aug 09, 2018 11:36 AM Yvette Rack wrote: Reason for CRM: Apolonio Schneiders Nurse Case Manager with [place] requests that the office notes from 07/07/18 visit be faxed to her at [phone]

## 2018-09-01 ENCOUNTER — Other Ambulatory Visit: Payer: Self-pay

## 2018-09-01 ENCOUNTER — Encounter: Payer: Self-pay | Admitting: Family Medicine

## 2018-09-01 ENCOUNTER — Ambulatory Visit (INDEPENDENT_AMBULATORY_CARE_PROVIDER_SITE_OTHER): Payer: Worker's Compensation | Admitting: Family Medicine

## 2018-09-01 DIAGNOSIS — M5442 Lumbago with sciatica, left side: Secondary | ICD-10-CM | POA: Diagnosis not present

## 2018-09-01 MED ORDER — CYCLOBENZAPRINE HCL 5 MG PO TABS: 2.5000 mg | ORAL_TABLET | Freq: Three times a day (TID) | ORAL | 0 refills | Status: AC | PRN

## 2018-09-01 NOTE — Progress Notes (Signed)
Subjective:    Patient ID: Luke Franco, male    DOB: 02-10-1956, 63 y.o.   MRN: [id number]  HPI Luke Franco is a 63 y.o. male Presents today for: Chief Complaint  Patient presents with  . Back Pain    f/u    Here for follow-up of back pain.  Injury occurred at work, date of injury 02/21/2018, initial evaluation 02/22/2018.  Has been treated with NSAIDs, Flexeril, physical therapy.  TENS unit with physical therapy then home TENS unit prescribed.  Had been using Flexeril as needed then Flexeril recommended at bedtime.   Last visit November 14th.  And physical therapy at that time.  Was improving compared to previous visit, about 70% improved.  Plan for return to work after he was 90% improved.  Was using Flexeril at night 3-4 times per week.  Occasional Tylenol use last visit.  Restrictions were slightly advanced last visit, here for recheck.  Pain was doing better with physical therapy. Planned to increase weights, but only had 1 more visit and unable to follow up further. Last visit few weeks ago.  Flexeril 2-3 times per week with pulling sensation. Last one at lunchtime today. Hard time getting comfortable.  Feels like pain in back a little better, but then worsened after PT stopped.  Home exercises (prescribed by PT) 1 hour per day, and walking 20 mins 3 times per week.  Tylenol - 2-3 times per week.  Back pain is60-70% better. Light lifting of groceries 2 days ago. Pulling sensation. Pain into left buttocks at times, not further down leg recently.   No bowel or bladder incontinence, no saddle anesthesia, no lower extremity weakness.   No new chest pains, shortness of breath, headaches, weakness.  Plans to follow-up with primary care provider regarding his blood pressure.  Prior to Admission medications   Medication Sig Start Date End Date Taking? Authorizing Provider  cetirizine (ZYRTEC) 10 MG tablet Take 10 mg by mouth daily.   Yes [provider]  cyclobenzaprine  (FLEXERIL) 5 MG tablet Take 0.5-1 tablets (2.5-5 mg total) by mouth 3 (three) times daily as needed for muscle spasms. 06/16/18  Yes Wendie Agreste, MD  hydrOXYzine (ATARAX/VISTARIL) 10 MG tablet Take 0.5-1 tablets (5-10 mg total) by mouth at bedtime as needed. 02/06/17  Yes Wendie Agreste, MD    Review of Systems  Gastrointestinal: Negative for abdominal pain.  Genitourinary: Negative for difficulty urinating.  Musculoskeletal: Positive for back pain and myalgias.  Skin: Negative for color change and rash.  Neurological: Negative for weakness.       No le weakness. No bowel/bladder incontinence, no saddle anesthesia.       Objective:   Physical Exam Constitutional:      General: He is not in acute distress.    Appearance: He is well-developed.  HENT:     Head: Normocephalic and atraumatic.  Neck:     Musculoskeletal: Normal range of motion.  Cardiovascular:     Rate and Rhythm: Normal rate.  Pulmonary:     Effort: Pulmonary effort is normal.  Abdominal:     Palpations: Abdomen is soft.     Tenderness: There is no abdominal tenderness.  Musculoskeletal:        General: Tenderness present.     Thoracic back: He exhibits tenderness.     Lumbar back: He exhibits tenderness and spasm. He exhibits normal range of motion and no bony tenderness.       Back:  Skin:  General: Skin is warm and dry.  Neurological:     Mental Status: He is alert and oriented to person, place, and time.     Sensory: No sensory deficit.     Deep Tendon Reflexes:     Reflex Scores:      Patellar reflexes are 2+ on the right side and 2+ on the left side.      Achilles reflexes are 2+ on the right side and 2+ on the left side.    Comments: Able to heel and toe walk without difficulty.  Psychiatric:        Behavior: Behavior normal.    Vitals:   09/01/18 1430 09/01/18 1502  BP: (!) 166/82 (!) 150/80  Pulse: 87   Temp: 98 F (36.7 C)   TempSrc: Oral   SpO2: 92%   Weight: 173 lb 12.8 oz  (78.8 kg)   Height: 5\' 10"  (1.778 m)           Assessment & Plan:   KOEHN SALEHI is a 63 y.o. male Acute left-sided low back pain with left-sided sciatica - Plan: cyclobenzaprine (FLEXERIL) 5 MG tablet, Ambulatory referral to Orthopedic Surgery  -Persistent left/midline low back pain with episodic sciatica to the left since injury at work July 1.  Did have some improvement in physical therapy, but now with plateau and slight increased back pain since stopping physical therapy.    -We will continue Tylenol, Flexeril if needed with potential side effects and risk discussed, refer to orthopedics for further evaluation and decisions on testing/treatment.  RTC/ER precautions given if worsening.  Return to work note with restrictions provided.  Advised to follow-up with primary care provider regarding elevated blood pressure.  ER precautions if symptomatic.  Understanding expressed  Meds ordered this encounter  Medications  . cyclobenzaprine (FLEXERIL) 5 MG tablet    Sig: Take 0.5-1 tablets (2.5-5 mg total) by mouth 3 (three) times daily as needed for muscle spasms.    Dispense:  20 tablet    Refill:  0   Patient Instructions    Okay to continue episodic Tylenol, and Flexeril only if needed for now.  No change in restrictions at this time.  I will sign off on further physical therapy if that order is sent, but will refer you to orthopedics for further evaluation and treatment plans for your back pain. Return to the clinic or go to the nearest emergency room if any of your symptoms worsen or new symptoms occur.  Please follow-up with your primary care provider soon as possible to discuss blood pressure readings.Return to the clinic or go to the nearest emergency room if any of your symptoms worsen or new symptoms occur.    If you have lab work done today you will be contacted with your lab results within the next 2 weeks.  If you have not heard from [place] then please contact us. The  fastest way to get your results is to register for My Chart.   IF you received an x-ray today, you will receive an invoice from [facility]. Please contact [facility] at [phone] with questions or concerns regarding your invoice.   IF you received labwork today, you will receive an invoice from [facility]. Please contact [facility] at [phone] with questions or concerns regarding your invoice.   Our billing staff will not be able to assist you with questions regarding bills from these companies.  You will be contacted with the lab results as soon as they are  available. The fastest way to get your results is to activate your My Chart account. Instructions are located on the last page of this paperwork. If you have not heard from [place] regarding the results in 2 weeks, please contact this office.       Signed,   [name], MD [facility].  09/01/18 3:28 PM

## 2018-09-01 NOTE — Patient Instructions (Addendum)
  Okay to continue episodic Tylenol, and Flexeril only if needed for now.  No change in restrictions at this time.  I will sign off on further physical therapy if that order is sent, but will refer you to orthopedics for further evaluation and treatment plans for your back pain. Return to the clinic or go to the nearest emergency room if any of your symptoms worsen or new symptoms occur.  Please follow-up with your primary care provider soon as possible to discuss blood pressure readings.Return to the clinic or go to the nearest emergency room if any of your symptoms worsen or new symptoms occur.    If you have lab work done today you will be contacted with your lab results within the next 2 weeks.  If you have not heard from [place] then please contact us. The fastest way to get your results is to register for My Chart.   IF you received an x-ray today, you will receive an invoice from [facility]. Please contact [facility] at [phone] with questions or concerns regarding your invoice.   IF you received labwork today, you will receive an invoice from [facility]. Please contact [facility] at [phone] with questions or concerns regarding your invoice.   Our billing staff will not be able to assist you with questions regarding bills from these companies.  You will be contacted with the lab results as soon as they are available. The fastest way to get your results is to activate your My Chart account. Instructions are located on the last page of this paperwork. If you have not heard from [place] regarding the results in 2 weeks, please contact this office.

## 2018-09-06 ENCOUNTER — Ambulatory Visit (INDEPENDENT_AMBULATORY_CARE_PROVIDER_SITE_OTHER): Payer: Self-pay | Admitting: Family Medicine

## 2018-10-09 IMAGING — DX DG LUMBAR SPINE COMPLETE 4+V
5 series · 5 of 5 positions shown · non-contrast
Comparison: None

CLINICAL DATA: Low back pain into LEFT leg after fall with twisting
yesterday

EXAM:
LUMBAR SPINE - COMPLETE 4+ VIEW

[l-spine ap]
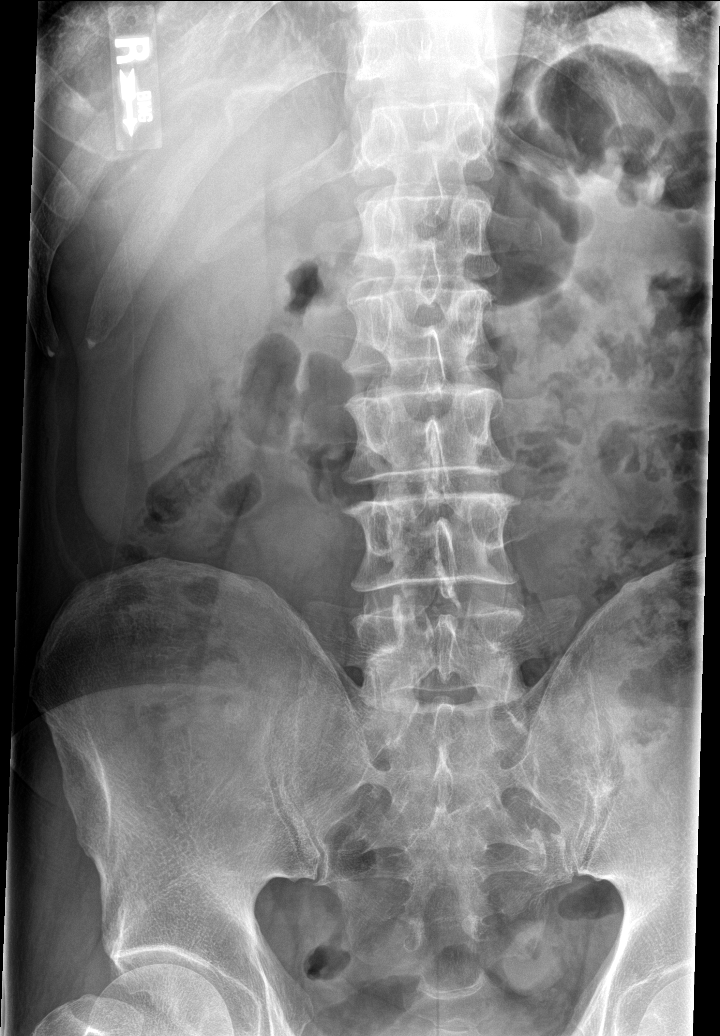

[l-spine obl (1 of 2)]
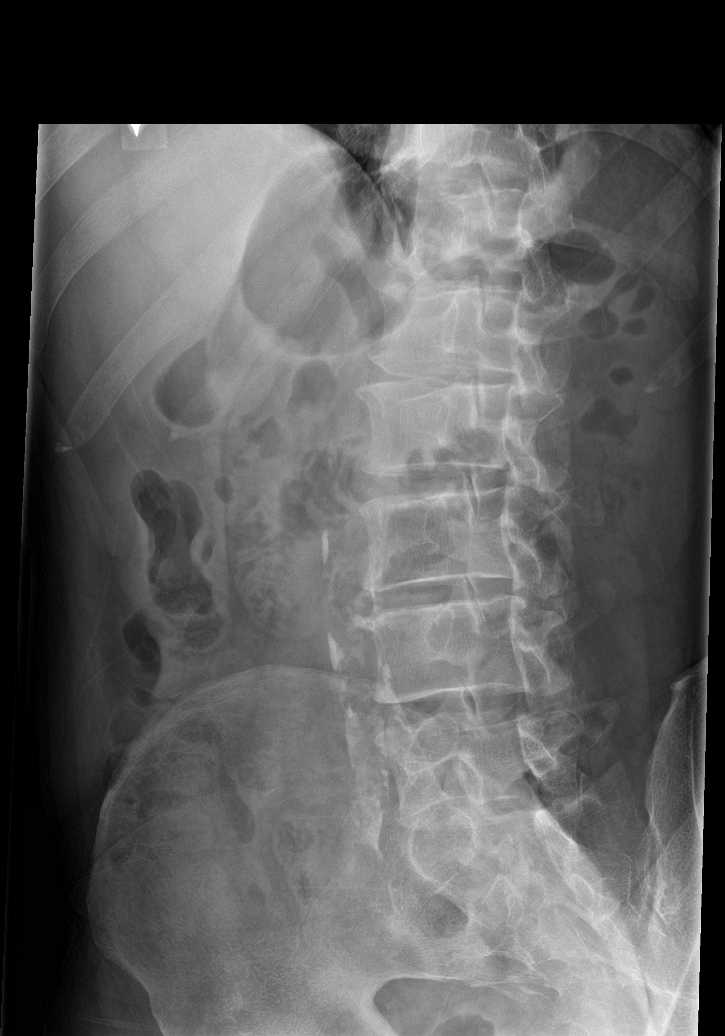

[l-spine obl (2 of 2)]
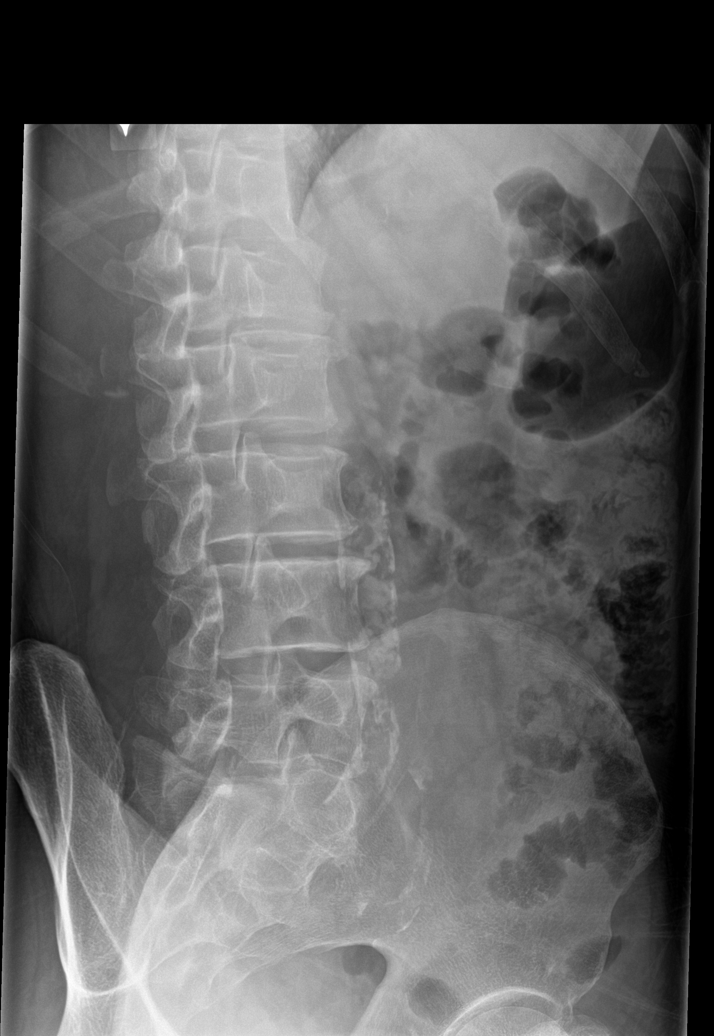

[l-spine lat]
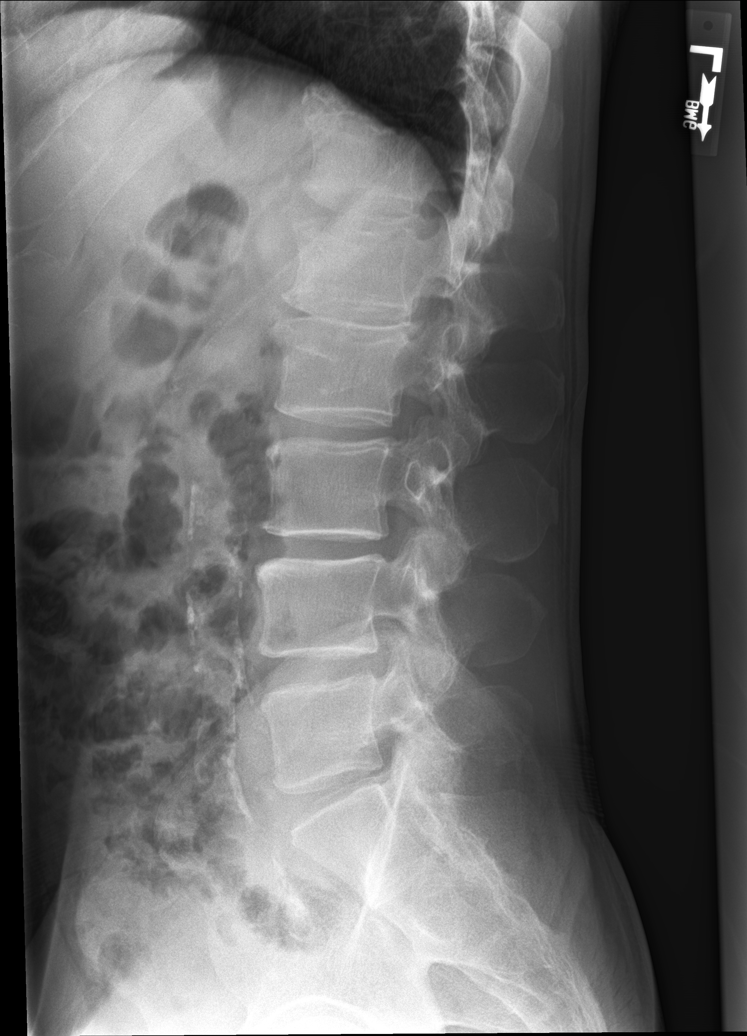

[l-spine l5-s1]
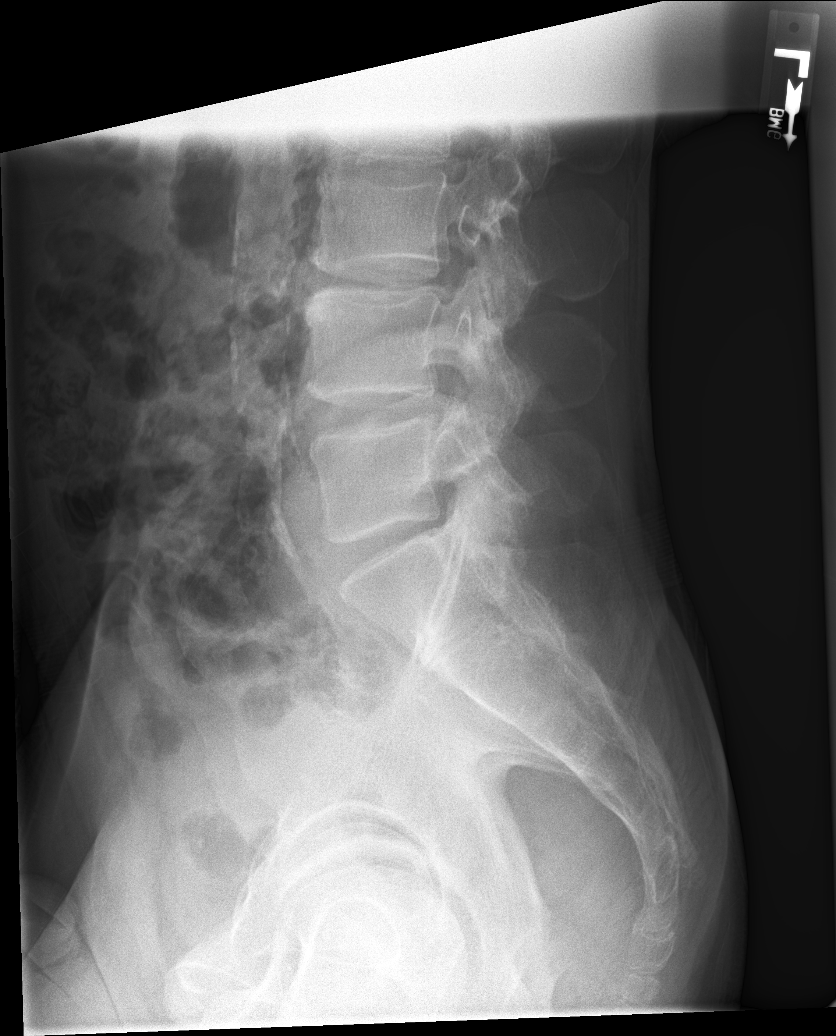

[5 of 5 positions shown; findings below may reference images not displayed]

FINDINGS: 5 non-rib-bearing lumbar vertebra.

Mild osseous demineralization.

Vertebral body and disc space heights maintained.

Minimal dextroconvex scoliosis.

No acute fracture, bone destruction, or spondylolysis.

Minimal retrolisthesis at L5-S1.

Endplate spur formation at L1-L2 and L3-L4.

Atherosclerotic calcifications aorta.

SI joints preserved.
IMPRESSION: Degenerative disc disease changes without acute abnormality.

Aortic Atherosclerosis ([id number]).

## 2018-10-25 ENCOUNTER — Other Ambulatory Visit: Payer: Self-pay

## 2018-10-25 ENCOUNTER — Encounter: Payer: Self-pay | Admitting: Family Medicine

## 2018-10-25 ENCOUNTER — Ambulatory Visit: Payer: Self-pay | Admitting: Family Medicine

## 2018-10-25 VITALS — BP 138/82 | HR 82 | Temp 98.6°F | Resp 16 | Ht 70.0 in | Wt 168.0 lb

## 2018-10-25 DIAGNOSIS — F411 Generalized anxiety disorder: Secondary | ICD-10-CM

## 2018-10-25 DIAGNOSIS — R05 Cough: Secondary | ICD-10-CM

## 2018-10-25 DIAGNOSIS — R062 Wheezing: Secondary | ICD-10-CM

## 2018-10-25 DIAGNOSIS — R059 Cough, unspecified: Secondary | ICD-10-CM

## 2018-10-25 DIAGNOSIS — J22 Unspecified acute lower respiratory infection: Secondary | ICD-10-CM

## 2018-10-25 MED ORDER — DOXYCYCLINE HYCLATE 100 MG PO TABS: 100.0000 mg | ORAL_TABLET | Freq: Two times a day (BID) | ORAL | 0 refills | Status: DC

## 2018-10-25 MED ORDER — BUSPIRONE HCL 7.5 MG PO TABS: 7.5000 mg | ORAL_TABLET | Freq: Three times a day (TID) | ORAL | 1 refills | Status: DC

## 2018-10-25 MED ORDER — ALBUTEROL SULFATE HFA 108 (90 BASE) MCG/ACT IN AERS: 1.0000 | INHALATION_SPRAY | RESPIRATORY_TRACT | 0 refills | Status: DC | PRN

## 2018-10-25 NOTE — Progress Notes (Signed)
Subjective:    Patient ID: Luke Franco, male    DOB: Oct 23, 1955, 63 y.o.   MRN: [id number]  HPI Luke Franco is a 63 y.o. male Presents today for: Chief Complaint  Patient presents with  . Cough    with some chest congestion,SHOB with wheezing and tightness  . Nasal Congestion    with drainage and fatigue x 8 days  . Anxiety   Cough: 8-9 days.  Productive slight green mucus.  Sx's same.  Tx: Coricidin, mucinex. No measured fevers., no foreign travel.  Sick contacts  With similar symptoms - bronchitis.  Smoker - 1/2 ppd. Smoker since 63yo. No known hx of asthma/copd.  Wheezing past 8 days - no inhalers.   Anxiety: Taking hydroxyzine nightly, wasn't working.  Anxious all day.  On alcohol. Marijuana about once per week - not recent.  Has had anxiety for years. Took diazepam in past, but no SSRi. No recent benzodiazepines.  Has not met with counselor in past.  Cold sweats at times.   No current insurance coverage.  Not working currently, still employed, but out due to workers comp injury.  Depression screen [facility] 2/9 10/25/2018 09/01/2018 06/16/2018 05/24/2018 05/05/2018  Decreased Interest 0 0 0 0 0  Down, Depressed, Hopeless 0 0 0 0 0  PHQ - 2 Score 0 0 0 0 0        Patient Active Problem List   Diagnosis Date Noted  . Low back pain radiating to left lower extremity 04/07/2018  . Tobacco abuse 06/30/2013   Past Medical History:  Diagnosis Date  . Allergy   . Anxiety    Past Surgical History:  Procedure Laterality Date  . KIDNEY STONE SURGERY     Allergies  Allergen Reactions  . Amoxicillin     vomiting  . Prednisone Nausea And Vomiting   Prior to Admission medications   Medication Sig Start Date End Date Taking? Authorizing Provider  cetirizine (ZYRTEC) 10 MG tablet Take 10 mg by mouth daily.   Yes [provider]  cyclobenzaprine (FLEXERIL) 5 MG tablet Take 0.5-1 tablets (2.5-5 mg total) by mouth 3 (three) times daily as needed for muscle  spasms. 09/01/18  Yes Wendie Agreste, MD   Social History   Socioeconomic History  . Marital status: Married    Spouse name: Not on file  . Number of children: 1  . Years of education: Not on file  . Highest education level: Not on file  Occupational History  . Occupation: pipe fitter  Social Needs  . Financial resource strain: Not on file  . Food insecurity:    Worry: Not on file    Inability: Not on file  . Transportation needs:    Medical: Not on file    Non-medical: Not on file  Tobacco Use  . Smoking status: Current Every Day Smoker    Packs/day: 1.00    Years: 25.00    Pack years: 25.00    Types: Cigarettes  . Smokeless tobacco: Never Used  Substance and Sexual Activity  . Alcohol use: No    Alcohol/week: 0.0 standard drinks  . Drug use: No  . Sexual activity: Not on file  Lifestyle  . Physical activity:    Days per week: Not on file    Minutes per session: Not on file  . Stress: Not on file  Relationships  . Social connections:    Talks on phone: Not on file    Gets together: Not on  file    Attends religious service: Not on file    Active member of club or organization: Not on file    Attends meetings of clubs or organizations: Not on file    Relationship status: Not on file  . Intimate partner violence:    Fear of current or ex partner: Not on file    Emotionally abused: Not on file    Physically abused: Not on file    Forced sexual activity: Not on file  Other Topics Concern  . Not on file  Social History Narrative  . Not on file    Review of Systems Per HPI.     Objective:   Physical Exam Vitals signs reviewed.  Constitutional:      Appearance: He is well-developed.  HENT:     Head: Normocephalic and atraumatic.     Right Ear: Tympanic membrane, ear canal and external ear normal.     Left Ear: Tympanic membrane, ear canal and external ear normal.     Nose: No rhinorrhea.     Mouth/Throat:     Pharynx: No oropharyngeal exudate or  posterior oropharyngeal erythema.  Eyes:     Conjunctiva/sclera: Conjunctivae normal.     Pupils: Pupils are equal, round, and reactive to light.  Neck:     Musculoskeletal: Neck supple.  Cardiovascular:     Rate and Rhythm: Normal rate and regular rhythm.     Heart sounds: Normal heart sounds. No murmur.  Pulmonary:     Effort: Pulmonary effort is normal.     Breath sounds: Wheezing (Minimal end expiratory, but otherwise clear with normal effort, no distress) present. No rhonchi or rales.  Abdominal:     Palpations: Abdomen is soft.     Tenderness: There is no abdominal tenderness.  Lymphadenopathy:     Cervical: No cervical adenopathy.  Skin:    General: Skin is warm and dry.     Findings: No rash.  Neurological:     Mental Status: He is alert and oriented to person, place, and time.  Psychiatric:        Attention and Perception: Attention normal.        Mood and Affect: Mood is anxious.        Speech: Speech normal.        Behavior: Behavior normal.    Vitals:   10/25/18 1157  BP: 138/82  Pulse: 82  Resp: 16  Temp: 98.6 F (37 C)  TempSrc: Oral  SpO2: 98%  Weight: 168 lb (76.2 kg)  Height: 5' 10"  (1.778 m)       Assessment & Plan:    Luke Franco is a 63 y.o. male GAD (generalized anxiety disorder) - Plan: busPIRone (BUSPAR) 7.5 MG tablet  - trial of buspar - BID initially then titrate to TID if tolerated.   - handout given, follow up to determine control  Cough - Plan: doxycycline (VIBRA-TABS) 100 MG tablet, albuterol (PROVENTIL HFA;VENTOLIN HFA) 108 (90 Base) MCG/ACT inhaler LRTI (lower respiratory tract infection) - Plan: doxycycline (VIBRA-TABS) 100 MG tablet Wheezing - Plan: albuterol (PROVENTIL HFA;VENTOLIN HFA) 108 (90 Base) MCG/ACT inhaler  -Initially treated with doxycycline for lower respiratory infection, albuterol inhaler every 4-6 hours as needed.  If persistent or frequent need of albuterol, would consider low-dose of prednisone with food.   Cautiously would use given prior intolerance.  RTC precautions given.   Meds ordered this encounter  Medications  . busPIRone (BUSPAR) 7.5 MG tablet    Sig: Take  1 tablet (7.5 mg total) by mouth 3 (three) times daily. Initially twice per day for 1st week.    Dispense:  90 tablet    Refill:  1  . doxycycline (VIBRA-TABS) 100 MG tablet    Sig: Take 1 tablet (100 mg total) by mouth 2 (two) times daily.    Dispense:  20 tablet    Refill:  0  . albuterol (PROVENTIL HFA;VENTOLIN HFA) 108 (90 Base) MCG/ACT inhaler    Sig: Inhale 1-2 puffs into the lungs every 4 (four) hours as needed for wheezing or shortness of breath.    Dispense:  1 Inhaler    Refill:  0   Patient Instructions   For anxiety try buspirone twice per day initially for the first week, then increase to 3 times per day.  See other information below.  For your cough, start antibiotic doxycycline twice per day.  Albuterol inhaler 1 to 2 puffs every 4-6 hours.  If cough/wheezing is not improving in the next few days, or you persistently need the albuterol I think it would be beneficial to try a low-dose of prednisone.  Based on your reaction in the past, possibly taking a low-dose with food may be better tolerated. Recheck if not improving in few days.   Return to the clinic or go to the nearest emergency room if any of your symptoms worsen or new symptoms occur.   Generalized Anxiety Disorder, Adult Generalized anxiety disorder (GAD) is a mental health disorder. People with this condition constantly worry about everyday events. Unlike normal anxiety, worry related to GAD is not triggered by a specific event. These worries also do not fade or get better with time. GAD interferes with life functions, including relationships, work, and school. GAD can vary from mild to severe. People with severe GAD can have intense waves of anxiety with physical symptoms (panic attacks). What are the causes? The exact cause of GAD is not known. What  increases the risk? This condition is more likely to develop in:  Women.  People who have a family history of anxiety disorders.  People who are very shy.  People who experience very stressful life events, such as the death of a loved one.  People who have a very stressful family environment. What are the signs or symptoms? People with GAD often worry excessively about many things in their lives, such as their health and family. They may also be overly concerned about:  Doing well at work.  Being on time.  Natural disasters.  Friendships. Physical symptoms of GAD include:  Fatigue.  Muscle tension or having muscle twitches.  Trembling or feeling shaky.  Being easily startled.  Feeling like your heart is pounding or racing.  Feeling out of breath or like you cannot take a deep breath.  Having trouble falling asleep or staying asleep.  Sweating.  Nausea, diarrhea, or irritable bowel syndrome (IBS).  Headaches.  Trouble concentrating or remembering facts.  Restlessness.  Irritability. How is this diagnosed? Your health care provider can diagnose GAD based on your symptoms and medical history. You will also have a physical exam. The health care provider will ask specific questions about your symptoms, including how severe they are, when they started, and if they come and go. Your health care provider may ask you about your use of alcohol or drugs, including prescription medicines. Your health care provider may refer you to a mental health specialist for further evaluation. Your health care provider will do a thorough examination  and may perform additional tests to rule out other possible causes of your symptoms. To be diagnosed with GAD, a person must have anxiety that:  Is out of his or her control.  Affects several different aspects of his or her life, such as work and relationships.  Causes distress that makes him or her unable to take part in normal  activities.  Includes at least three physical symptoms of GAD, such as restlessness, fatigue, trouble concentrating, irritability, muscle tension, or sleep problems. Before your health care provider can confirm a diagnosis of GAD, these symptoms must be present more days than they are not, and they must last for six months or longer. How is this treated? The following therapies are usually used to treat GAD:  Medicine. Antidepressant medicine is usually prescribed for long-term daily control. Antianxiety medicines may be added in severe cases, especially when panic attacks occur.  Talk therapy (psychotherapy). Certain types of talk therapy can be helpful in treating GAD by providing support, education, and guidance. Options include: ? Cognitive behavioral therapy (CBT). People learn coping skills and techniques to ease their anxiety. They learn to identify unrealistic or negative thoughts and behaviors and to replace them with positive ones. ? Acceptance and commitment therapy (ACT). This treatment teaches people how to be mindful as a way to cope with unwanted thoughts and feelings. ? Biofeedback. This process trains you to manage your body's response (physiological response) through breathing techniques and relaxation methods. You will work with a therapist while machines are used to monitor your physical symptoms.  Stress management techniques. These include yoga, meditation, and exercise. A mental health specialist can help determine which treatment is best for you. Some people see improvement with one type of therapy. However, other people require a combination of therapies. Follow these instructions at home:  Take over-the-counter and prescription medicines only as told by your health care provider.  Try to maintain a normal routine.  Try to anticipate stressful situations and allow extra time to manage them.  Practice any stress management or self-calming techniques as taught by your  health care provider.  Do not punish yourself for setbacks or for not making progress.  Try to recognize your accomplishments, even if they are small.  Keep all follow-up visits as told by your health care provider. This is important. Contact a health care provider if:  Your symptoms do not get better.  Your symptoms get worse.  You have signs of depression, such as: ? A persistently sad, cranky, or irritable mood. ? Loss of enjoyment in activities that used to bring you joy. ? Change in weight or eating. ? Changes in sleeping habits. ? Avoiding friends or family members. ? Loss of energy for normal tasks. ? Feelings of guilt or worthlessness. Get help right away if:  You have serious thoughts about hurting yourself or others. If you ever feel like you may hurt yourself or others, or have thoughts about taking your own life, get help right away. You can go to your nearest emergency department or call:  Your local emergency services (911 in the [place]  A suicide crisis helpline, such as the [facility] at 630-233-4128. This is open 24 hours a day. Summary  Generalized anxiety disorder (GAD) is a mental health disorder that involves worry that is not triggered by a specific event.  People with GAD often worry excessively about many things in their lives, such as their health and family.  GAD may cause physical symptoms  such as restlessness, trouble concentrating, sleep problems, frequent sweating, nausea, diarrhea, headaches, and trembling or muscle twitching.  A mental health specialist can help determine which treatment is best for you. Some people see improvement with one type of therapy. However, other people require a combination of therapies. This information is not intended to replace advice given to you by your health care provider. Make sure you discuss any questions you have with your health care provider. Document Released: 12/05/2012  Document Revised: 06/30/2016 Document Reviewed: 06/30/2016 Elsevier Interactive Patient Education  [employer].   If you have lab work done today you will be contacted with your lab results within the next 2 weeks.  If you have not heard from [place] then please contact us. The fastest way to get your results is to register for My Chart.   IF you received an x-ray today, you will receive an invoice from [facility]. Please contact [facility] at [phone] with questions or concerns regarding your invoice.   IF you received labwork today, you will receive an invoice from [facility]. Please contact [facility] at [phone] with questions or concerns regarding your invoice.   Our billing staff will not be able to assist you with questions regarding bills from these companies.  You will be contacted with the lab results as soon as they are available. The fastest way to get your results is to activate your My Chart account. Instructions are located on the last page of this paperwork. If you have not heard from [place] regarding the results in 2 weeks, please contact this office.       Signed,   [name], MD [facility].  10/26/18 9:57 PM

## 2018-10-25 NOTE — Patient Instructions (Addendum)
For anxiety try buspirone twice per day initially for the first week, then increase to 3 times per day.  See other information below.  For your cough, start antibiotic doxycycline twice per day.  Albuterol inhaler 1 to 2 puffs every 4-6 hours.  If cough/wheezing is not improving in the next few days, or you persistently need the albuterol I think it would be beneficial to try a low-dose of prednisone.  Based on your reaction in the past, possibly taking a low-dose with food may be better tolerated. Recheck if not improving in few days.   Return to the clinic or go to the nearest emergency room if any of your symptoms worsen or new symptoms occur.   Generalized Anxiety Disorder, Adult Generalized anxiety disorder (GAD) is a mental health disorder. People with this condition constantly worry about everyday events. Unlike normal anxiety, worry related to GAD is not triggered by a specific event. These worries also do not fade or get better with time. GAD interferes with life functions, including relationships, work, and school. GAD can vary from mild to severe. People with severe GAD can have intense waves of anxiety with physical symptoms (panic attacks). What are the causes? The exact cause of GAD is not known. What increases the risk? This condition is more likely to develop in:  Women.  People who have a family history of anxiety disorders.  People who are very shy.  People who experience very stressful life events, such as the death of a loved one.  People who have a very stressful family environment. What are the signs or symptoms? People with GAD often worry excessively about many things in their lives, such as their health and family. They may also be overly concerned about:  Doing well at work.  Being on time.  Natural disasters.  Friendships. Physical symptoms of GAD include:  Fatigue.  Muscle tension or having muscle twitches.  Trembling or feeling shaky.  Being  easily startled.  Feeling like your heart is pounding or racing.  Feeling out of breath or like you cannot take a deep breath.  Having trouble falling asleep or staying asleep.  Sweating.  Nausea, diarrhea, or irritable bowel syndrome (IBS).  Headaches.  Trouble concentrating or remembering facts.  Restlessness.  Irritability. How is this diagnosed? Your health care provider can diagnose GAD based on your symptoms and medical history. You will also have a physical exam. The health care provider will ask specific questions about your symptoms, including how severe they are, when they started, and if they come and go. Your health care provider may ask you about your use of alcohol or drugs, including prescription medicines. Your health care provider may refer you to a mental health specialist for further evaluation. Your health care provider will do a thorough examination and may perform additional tests to rule out other possible causes of your symptoms. To be diagnosed with GAD, a person must have anxiety that:  Is out of his or her control.  Affects several different aspects of his or her life, such as work and relationships.  Causes distress that makes him or her unable to take part in normal activities.  Includes at least three physical symptoms of GAD, such as restlessness, fatigue, trouble concentrating, irritability, muscle tension, or sleep problems. Before your health care provider can confirm a diagnosis of GAD, these symptoms must be present more days than they are not, and they must last for six months or longer. How is this treated?  The following therapies are usually used to treat GAD:  Medicine. Antidepressant medicine is usually prescribed for long-term daily control. Antianxiety medicines may be added in severe cases, especially when panic attacks occur.  Talk therapy (psychotherapy). Certain types of talk therapy can be helpful in treating GAD by providing  support, education, and guidance. Options include: ? Cognitive behavioral therapy (CBT). People learn coping skills and techniques to ease their anxiety. They learn to identify unrealistic or negative thoughts and behaviors and to replace them with positive ones. ? Acceptance and commitment therapy (ACT). This treatment teaches people how to be mindful as a way to cope with unwanted thoughts and feelings. ? Biofeedback. This process trains you to manage your body's response (physiological response) through breathing techniques and relaxation methods. You will work with a therapist while machines are used to monitor your physical symptoms.  Stress management techniques. These include yoga, meditation, and exercise. A mental health specialist can help determine which treatment is best for you. Some people see improvement with one type of therapy. However, other people require a combination of therapies. Follow these instructions at home:  Take over-the-counter and prescription medicines only as told by your health care provider.  Try to maintain a normal routine.  Try to anticipate stressful situations and allow extra time to manage them.  Practice any stress management or self-calming techniques as taught by your health care provider.  Do not punish yourself for setbacks or for not making progress.  Try to recognize your accomplishments, even if they are small.  Keep all follow-up visits as told by your health care provider. This is important. Contact a health care provider if:  Your symptoms do not get better.  Your symptoms get worse.  You have signs of depression, such as: ? A persistently sad, cranky, or irritable mood. ? Loss of enjoyment in activities that used to bring you joy. ? Change in weight or eating. ? Changes in sleeping habits. ? Avoiding friends or family members. ? Loss of energy for normal tasks. ? Feelings of guilt or worthlessness. Get help right away  if:  You have serious thoughts about hurting yourself or others. If you ever feel like you may hurt yourself or others, or have thoughts about taking your own life, get help right away. You can go to your nearest emergency department or call:  Your local emergency services (911 in the [place]  A suicide crisis helpline, such as the [place] at 612-280-7128. This is open 24 hours a day. Summary  Generalized anxiety disorder (GAD) is a mental health disorder that involves worry that is not triggered by a specific event.  People with GAD often worry excessively about many things in their lives, such as their health and family.  GAD may cause physical symptoms such as restlessness, trouble concentrating, sleep problems, frequent sweating, nausea, diarrhea, headaches, and trembling or muscle twitching.  A mental health specialist can help determine which treatment is best for you. Some people see improvement with one type of therapy. However, other people require a combination of therapies. This information is not intended to replace advice given to you by your health care provider. Make sure you discuss any questions you have with your health care provider. Document Released: 12/05/2012 Document Revised: 06/30/2016 Document Reviewed: 06/30/2016 Elsevier Interactive Patient Education  [employer].   If you have lab work done today you will be contacted with your lab results within the next 2 weeks.  If you  have not heard from [place] then please contact us. The fastest way to get your results is to register for My Chart.   IF you received an x-ray today, you will receive an invoice from [facility]. Please contact [facility] at [phone] with questions or concerns regarding your invoice.   IF you received labwork today, you will receive an invoice from [place]. Please contact [facility] at [phone] with questions or concerns regarding your  invoice.   Our billing staff will not be able to assist you with questions regarding bills from these companies.  You will be contacted with the lab results as soon as they are available. The fastest way to get your results is to activate your My Chart account. Instructions are located on the last page of this paperwork. If you have not heard from [place] regarding the results in 2 weeks, please contact this office.

## 2018-10-26 ENCOUNTER — Encounter: Payer: Self-pay | Admitting: Family Medicine

## 2018-10-27 ENCOUNTER — Ambulatory Visit: Payer: Self-pay

## 2018-10-27 NOTE — Telephone Encounter (Signed)
Outgoing call to Patient who  Complains of taking Buspar 7.5mg   Has not  Slept since.  Patient  States that  "It has has kept me up all night" States he has Clammy sweats  All night.  He is afraid to take it again.  Patient  Would like a return phone call from [name] in reference  to this.   Luke Franco [facility] Male, 63 y.o., March 12, 1956 MRN:  [id number] Phone:  [phone] (H) PCP:  Wendie Agreste, MD Primary [facility] COMP/GENERIC [facility] [place] [facility] Message from Gustavus Messing sent at 10/27/2018 1:27 PM EST   Summary: medication managemt    Patient was prescribed busPIRone (BUSPAR) 7.5 MG tablet [[id number]] and has not slept in since it was given to him. He wants to know if the doctor could possibly change the frequency or the dosage        Call History    Type Contact  10/27/2018 01:24 PM Phone (Incoming) Luke Franco (Self)  Phone: [phone] Jerilynn Mages)  User: Gustavus Messing    Reason for Disposition . Caller has URGENT medication question about med that PCP prescribed and triager unable to answer question  Answer Assessment - Initial Assessment Questions 1. SYMPTOMS: "Do you have any symptoms?"     Kept  Me up  All night  Clammy sweats.  2. SEVERITY: If symptoms are present, ask "Are they mild, moderate or severe?"     severe  Protocols used: MEDICATION QUESTION CALL-A-AH

## 2018-10-28 MED ORDER — SERTRALINE HCL 50 MG PO TABS: 50.0000 mg | ORAL_TABLET | Freq: Every day | ORAL | 1 refills | Status: DC

## 2018-10-28 MED ORDER — CLONAZEPAM 0.5 MG PO TABS: 0.2500 mg | ORAL_TABLET | Freq: Two times a day (BID) | ORAL | 0 refills | Status: DC | PRN

## 2018-10-28 NOTE — Telephone Encounter (Signed)
I have called the pt and informed him of the undated plan and he stated understanding

## 2018-10-28 NOTE — Telephone Encounter (Signed)
Noted.  He can stop BuSpar at this time.  Start Zoloft 50 mg once per day to help with anxiety.  I will agree to also temporarily write for low-dose benzodiazepine until other medication has had a chance to work.  Can take Klonopin 1/2 to 1 pill up to twice per day as needed.  Would recommend initially starting at bedtime.  Should follow-up with me in the next few weeks.  Please call patient with this plan.  Thanks

## 2018-10-28 NOTE — Telephone Encounter (Signed)
Please advise on message below. Pt was just seen 10/25/2018.

## 2018-11-07 ENCOUNTER — Ambulatory Visit: Payer: Self-pay | Admitting: Family Medicine

## 2018-11-07 ENCOUNTER — Encounter: Payer: Self-pay | Admitting: Family Medicine

## 2018-11-07 ENCOUNTER — Ambulatory Visit: Payer: Self-pay

## 2018-11-07 ENCOUNTER — Other Ambulatory Visit: Payer: Self-pay

## 2018-11-07 VITALS — BP 146/84 | HR 104 | Temp 98.3°F | Resp 17 | Ht 70.0 in | Wt 167.4 lb

## 2018-11-07 DIAGNOSIS — J0110 Acute frontal sinusitis, unspecified: Secondary | ICD-10-CM

## 2018-11-07 MED ORDER — LEVOFLOXACIN 500 MG PO TABS: 500.0000 mg | ORAL_TABLET | Freq: Every day | ORAL | 0 refills | Status: DC

## 2018-11-07 NOTE — Telephone Encounter (Signed)
Pt c/o pressure behind eyes, nausea, dizziness, mild cough, ears feel "itchy", occasional waking up sweating and chills. Pt denies subjective fever today or cough. . Pt stated his pain is severe. Symptoms started Friday.  Denies international travel or [place] travel.  Care advice given to pt and pt verbalized understanding. No appts available today. Pt given SDA with Dr Pamella Pert.   Reason for Disposition . [1] Sinus pain (not just congestion) AND [2] fever  Answer Assessment - Initial Assessment Questions 1. LOCATION: "Where does it hurt?"      Behind eye sinuses (pressure) feel like are draining to stomach and making stomach upset, eyes water and headache 2. ONSET: "When did the sinus pain start?"  (e.g., hours, days)      Friday 3. SEVERITY: "How bad is the pain?"   (Scale 1-10; mild, moderate or severe)   - MILD (1-3): doesn't interfere with normal activities    - MODERATE (4-7): interferes with normal activities (e.g., work or school) or awakens from sleep   - SEVERE (8-10): excruciating pain and patient unable to do any normal activities        severe 4. RECURRENT SYMPTOM: "Have you ever had sinus problems before?" If so, ask: "When was the last time?" and "What happened that time?"      Yes-2 years ago-a steroid injection and rx antibiotic had to 2 rounds of antibiotics 5. NASAL CONGESTION: "Is the nose blocked?" If so, ask, "Can you open it or must you breathe through the mouth?"     no 6. NASAL DISCHARGE: "Do you have discharge from your nose?" If so ask, "What color?"     Yes- intermittently yellow in color 7. FEVER: "Do you have a fever?" If so, ask: "What is it, how was it measured, and when did it start?"     No subjective fever- but waking up sweating, chills 8. OTHER SYMPTOMS: "Do you have any other symptoms?" (e.g., sore throat, cough, earache, difficulty breathing)     Dizzy, nauseated, eyes watering, cough, ears feel "itchy"waking up sweating and chills-subjective fever- No  international travel or known exposure 9. PREGNANCY: "Is there any chance you are pregnant?" "When was your last menstrual period?"     n/a  Protocols used: SINUS PAIN OR CONGESTION-A-AH

## 2018-11-07 NOTE — Progress Notes (Signed)
3/16/20204:08 PM  Luke Franco 1955/11/27, 63 y.o. male [id number]  Chief Complaint  Patient presents with  . Sinusitis    with congestion/nausea x 3 weeks.  Otc meds for symptoms other than tylenol.  Tylenol last taken on yesterday    HPI:   Patient is a 64 y.o. male with past medical history significant for GAD who presents today for 3- 4 days of sinus pressure, headache, nausea, dizziness  Seen on 10/25/2018 by Dr Carlota Raspberry Treated with doxy and albuterol for LTRI - cough and congestion resolved, however he was unable to complete course of doxy due to nausea and vomiting However 3-4 days sinus pressure, headache, nausea, dizziness Described green-yellow mucous Having watery eyes from pressure Having itchy eyes Having fever, chills, some achiness Has not had flu vaccine this season Smoking 1/2 pdd, has been cutting back, used to smoke 2ppd No sinus surgery    Fall Risk  11/07/2018 10/25/2018 09/01/2018 07/07/2018 06/16/2018  Falls in the past year? 0 0 0 0 No  Comment - - - - -  Number falls in past yr: 0 - - - -  Injury with Fall? 0 0 - - -  Follow up Falls evaluation completed - - - -     Depression screen [facility] 2/9 11/07/2018 10/25/2018 09/01/2018  Decreased Interest 0 0 0  Down, Depressed, Hopeless 0 0 0  PHQ - 2 Score 0 0 0    Allergies  Allergen Reactions  . Amoxicillin     vomiting  . Prednisone Nausea And Vomiting    Prior to Admission medications   Medication Sig Start Date End Date Taking? Authorizing Provider  albuterol (PROVENTIL HFA;VENTOLIN HFA) 108 (90 Base) MCG/ACT inhaler Inhale 1-2 puffs into the lungs every 4 (four) hours as needed for wheezing or shortness of breath. 10/25/18  Yes Wendie Agreste, MD  cetirizine (ZYRTEC) 10 MG tablet Take 10 mg by mouth daily.   Yes [provider]  clonazePAM (KLONOPIN) 0.5 MG tablet Take 0.5-1 tablets (0.25-0.5 mg total) by mouth 2 (two) times daily as needed for anxiety. 10/28/18  Yes Wendie Agreste, MD   cyclobenzaprine (FLEXERIL) 5 MG tablet Take 0.5-1 tablets (2.5-5 mg total) by mouth 3 (three) times daily as needed for muscle spasms. 09/01/18  Yes Wendie Agreste, MD  doxycycline (VIBRA-TABS) 100 MG tablet Take 1 tablet (100 mg total) by mouth 2 (two) times daily. 10/25/18  Yes Wendie Agreste, MD  sertraline (ZOLOFT) 50 MG tablet Take 1 tablet (50 mg total) by mouth daily. 10/28/18  Yes Wendie Agreste, MD    Past Medical History:  Diagnosis Date  . Allergy   . Anxiety     Past Surgical History:  Procedure Laterality Date  . KIDNEY STONE SURGERY      Social History   Tobacco Use  . Smoking status: Current Every Day Smoker    Packs/day: 1.00    Years: 25.00    Pack years: 25.00    Types: Cigarettes  . Smokeless tobacco: Never Used  Substance Use Topics  . Alcohol use: No    Alcohol/week: 0.0 standard drinks    Family History  Problem Relation Age of Onset  . Diabetes Mother   . Heart disease Mother   . Diabetes Brother   . Diabetes Sister   . Hyperlipidemia Sister   . Diabetes Brother   . Hyperlipidemia Brother   . Colon cancer Neg Hx   . Esophageal cancer Neg Hx   .  Stomach cancer Neg Hx   . Rectal cancer Neg Hx     ROS Per hpi  OBJECTIVE:  Blood pressure (!) 146/84, pulse (!) 104, temperature 98.3 F (36.8 C), temperature source Oral, resp. rate 17, height 5\' 10"  (1.778 m), weight 167 lb 6.4 oz (75.9 kg), SpO2 97 %. Body mass index is 24.02 kg/m.   Physical Exam Vitals signs and nursing note reviewed.  Constitutional:      Appearance: He is well-developed.  HENT:     Head: Normocephalic and atraumatic.     Right Ear: Hearing, tympanic membrane, ear canal and external ear normal.     Left Ear: Hearing, tympanic membrane, ear canal and external ear normal.     Nose: Rhinorrhea present. Rhinorrhea is purulent.     Right Sinus: Frontal sinus tenderness present.     Left Sinus: Frontal sinus tenderness present.     Mouth/Throat:     Pharynx: No  oropharyngeal exudate.  Eyes:     Conjunctiva/sclera: Conjunctivae normal.     Pupils: Pupils are equal, round, and reactive to light.  Neck:     Musculoskeletal: Neck supple.  Cardiovascular:     Rate and Rhythm: Normal rate and regular rhythm.     Heart sounds: No murmur. No friction rub. No gallop.   Pulmonary:     Effort: Pulmonary effort is normal.     Breath sounds: Normal breath sounds. No wheezing or rales.  Lymphadenopathy:     Cervical: No cervical adenopathy.  Skin:    General: Skin is warm and dry.  Neurological:     Mental Status: He is alert and oriented to person, place, and time.     ASSESSMENT and PLAN  1. Acute non-recurrent frontal sinusitis Discussed supportive measures, new meds r/se/b and RTC precautions.  Other orders - levofloxacin (LEVAQUIN) 500 MG tablet; Take 1 tablet (500 mg total) by mouth daily.  Return if symptoms worsen or fail to improve.    Rutherford Guys, MD Primary Care at [place], [place] [address] Ph.  [phone] Fax [phone]

## 2018-11-07 NOTE — Patient Instructions (Signed)
° ° ° °  If you have lab work done today you will be contacted with your lab results within the next 2 weeks.  If you have not heard from us then please contact us. The fastest way to get your results is to register for My Chart. ° ° °IF you received an x-ray today, you will receive an invoice from [facility]. Please contact [facility] at [phone] with questions or concerns regarding your invoice.  ° °IF you received labwork today, you will receive an invoice from [facility]. Please contact [facility] at [phone] with questions or concerns regarding your invoice.  ° °Our billing staff will not be able to assist you with questions regarding bills from these companies. ° °You will be contacted with the lab results as soon as they are available. The fastest way to get your results is to activate your My Chart account. Instructions are located on the last page of this paperwork. If you have not heard from us regarding the results in 2 weeks, please contact this office. °  ° ° ° °

## 2018-12-22 ENCOUNTER — Telehealth: Payer: Self-pay | Admitting: Family Medicine

## 2018-12-22 NOTE — Telephone Encounter (Signed)
Copied from [place] [id number]. Topic: General - Other >> Dec 22, 2018 12:46 PM Carolyn Stare wrote:  Pt req refill   on  clonazePAM (KLONOPIN) 0.5 MG tablet   [facility] [address] [place]

## 2018-12-23 MED ORDER — CLONAZEPAM 0.5 MG PO TABS: 0.2500 mg | ORAL_TABLET | Freq: Two times a day (BID) | ORAL | 0 refills | Status: DC | PRN

## 2018-12-23 NOTE — Telephone Encounter (Signed)
Controlled substance database (PDMP) reviewed. No concerns appreciated. Last filled 11/11/18.   Klonopin refilled, but plan was for him to follow up in few weeks in March. Please schedule telemed visit in next 2 weeks.

## 2019-01-19 NOTE — Telephone Encounter (Signed)
MEDICATION F/U SCHEDULED

## 2019-02-02 DIAGNOSIS — M4726 Other spondylosis with radiculopathy, lumbar region: Secondary | ICD-10-CM | POA: Insufficient documentation

## 2019-02-02 DIAGNOSIS — G894 Chronic pain syndrome: Secondary | ICD-10-CM | POA: Insufficient documentation

## 2019-02-02 DIAGNOSIS — M48061 Spinal stenosis, lumbar region without neurogenic claudication: Secondary | ICD-10-CM | POA: Insufficient documentation

## 2019-02-02 DIAGNOSIS — M47814 Spondylosis without myelopathy or radiculopathy, thoracic region: Secondary | ICD-10-CM | POA: Insufficient documentation

## 2019-02-02 DIAGNOSIS — G8929 Other chronic pain: Secondary | ICD-10-CM | POA: Insufficient documentation

## 2019-02-02 DIAGNOSIS — M5136 Other intervertebral disc degeneration, lumbar region: Secondary | ICD-10-CM | POA: Insufficient documentation

## 2019-02-07 ENCOUNTER — Ambulatory Visit (INDEPENDENT_AMBULATORY_CARE_PROVIDER_SITE_OTHER): Payer: Self-pay | Admitting: Family Medicine

## 2019-02-07 ENCOUNTER — Encounter: Payer: Self-pay | Admitting: Family Medicine

## 2019-02-07 ENCOUNTER — Other Ambulatory Visit: Payer: Self-pay

## 2019-02-07 VITALS — BP 127/76 | HR 101 | Temp 98.3°F | Resp 14 | Wt 173.6 lb

## 2019-02-07 DIAGNOSIS — F411 Generalized anxiety disorder: Secondary | ICD-10-CM

## 2019-02-07 DIAGNOSIS — B070 Plantar wart: Secondary | ICD-10-CM

## 2019-02-07 MED ORDER — CLONAZEPAM 0.5 MG PO TABS: 0.2500 mg | ORAL_TABLET | Freq: Two times a day (BID) | ORAL | 0 refills | Status: DC | PRN

## 2019-02-07 MED ORDER — SERTRALINE HCL 100 MG PO TABS: 100.0000 mg | ORAL_TABLET | Freq: Every day | ORAL | 1 refills | Status: DC

## 2019-02-07 NOTE — Progress Notes (Signed)
Subjective:    Patient ID: Luke Franco, male    DOB: 1955/10/09, 63 y.o.   MRN: [id number]  HPI Luke Franco is a 63 y.o. male Presents today for: Chief Complaint  Patient presents with   Anxiety    Need refills on buspirone, flexeril, clonazepam   Plantar Warts    wart on the bottom arch of right foot   Generalized anxiety disorder: Last discussed in March - appt March 3rd..  Planned 2-week follow-up at that time.  We tried starting BuSpar but did not tolerate buspirone. Called nurse line 2 days later.  Had tried meds for 2 day- caused significant insomnia, clammy sweats all night. Low-dose clonazepam temporarily given until he had some improvement with Zoloft 50 mg daily. Taking sertraline 50mg  daily. No side effects with sertraline.  Anxiety is doing better, taking klonopin 1/4 -1 pill few times per week.  Has 3 pills with 3 small fragments left.   Controlled substance database (PDMP) reviewed. No concerns appreciated. Klonopin # 20 filled 3/6 and 12/23/18.   Depression screen [facility] 2/9 11/07/2018 10/25/2018 09/01/2018 06/16/2018 05/24/2018  Decreased Interest 0 0 0 0 0  Down, Depressed, Hopeless 0 0 0 0 0  PHQ - 2 Score 0 0 0 0 0   GAD 7 : Generalized Anxiety Score 02/07/2019  Nervous, Anxious, on Edge 0  Control/stop worrying 0  Worry too much - different things 0  Trouble relaxing 0  Restless 0  Easily annoyed or irritable 1  Afraid - awful might happen 0  Total GAD 7 Score 1    Plantar wart: 7-8 years getting bigger over that time. More firm past few months and sore on that area. less of an issue when working. No attempted treatments.   Low back pain: Request Flexeril refill.  See previous notes.  I referred him January 9 to orthopedics for ongoing care.  Episodic Tylenol and Flexeril were continued temporarily until he was under their care. He is now under the care of [facility] in [facility] Dr. Francesco Runner back specialist. Plans for  epidural.  Taking flexeril few times per week. Not on narcotics.     Patient Active Problem List   Diagnosis Date Noted   Low back pain radiating to left lower extremity 04/07/2018   Tobacco abuse 06/30/2013   Past Medical History:  Diagnosis Date   Allergy    Anxiety    Past Surgical History:  Procedure Laterality Date   KIDNEY STONE SURGERY     Allergies  Allergen Reactions   Amoxicillin     vomiting   Prednisone Nausea And Vomiting   Prior to Admission medications   Medication Sig Start Date End Date Taking? Authorizing Provider  albuterol (PROVENTIL HFA;VENTOLIN HFA) 108 (90 Base) MCG/ACT inhaler Inhale 1-2 puffs into the lungs every 4 (four) hours as needed for wheezing or shortness of breath. 10/25/18  Yes Wendie Agreste, MD  cetirizine (ZYRTEC) 10 MG tablet Take 10 mg by mouth daily.   Yes [provider]  clonazePAM (KLONOPIN) 0.5 MG tablet Take 0.5-1 tablets (0.25-0.5 mg total) by mouth 2 (two) times daily as needed for anxiety. 12/23/18  Yes Wendie Agreste, MD  cyclobenzaprine (FLEXERIL) 5 MG tablet Take 0.5-1 tablets (2.5-5 mg total) by mouth 3 (three) times daily as needed for muscle spasms. 09/01/18  Yes Wendie Agreste, MD  sertraline (ZOLOFT) 50 MG tablet Take 1 tablet (50 mg total) by mouth daily. 10/28/18  Yes Wendie Agreste, MD   Social History   Socioeconomic History   Marital status: Married    Spouse name: Not on file   Number of children: 1   Years of education: Not on file   Highest education level: Not on file  Occupational History   Occupation: pipe fitter  Social Needs   Financial resource strain: Not on file   Food insecurity    Worry: Not on file    Inability: Not on file   Transportation needs    Medical: Not on file    Non-medical: Not on file  Tobacco Use   Smoking status: Current Every Day Smoker    Packs/day: 1.00    Years: 25.00    Pack years: 25.00    Types: Cigarettes   Smokeless tobacco: Never  Used  Substance and Sexual Activity   Alcohol use: No    Alcohol/week: 0.0 standard drinks   Drug use: No   Sexual activity: Not on file  Lifestyle   Physical activity    Days per week: Not on file    Minutes per session: Not on file   Stress: Not on file  Relationships   Social connections    Talks on phone: Not on file    Gets together: Not on file    Attends religious service: Not on file    Active member of club or organization: Not on file    Attends meetings of clubs or organizations: Not on file    Relationship status: Not on file   Intimate partner violence    Fear of current or ex partner: Not on file    Emotionally abused: Not on file    Physically abused: Not on file    Forced sexual activity: Not on file  Other Topics Concern   Not on file  Social History Narrative   Not on file    Review of Systems     Objective:   Physical Exam Constitutional:      General: He is not in acute distress.    Appearance: He is well-developed.  HENT:     Head: Normocephalic and atraumatic.  Cardiovascular:     Rate and Rhythm: Normal rate.  Pulmonary:     Effort: Pulmonary effort is normal.  Skin:      Neurological:     Mental Status: He is alert and oriented to person, place, and time.    Vitals:   02/07/19 1548  BP: 127/76  Pulse: (!) 101  Resp: 14  Temp: 98.3 F (36.8 C)  TempSrc: Oral  SpO2: 97%  Weight: 173 lb 9.6 oz (78.7 kg)       Assessment & Plan:   Luke Franco is a 63 y.o. male Plantar wart, right foot - Plan: Ambulatory referral to Podiatry  - large suspected plantar wart. Due to size, suspect it will need paring/debulking then possible cryo therapy.   GAD (generalized anxiety disorder) - Plan: sertraline (ZOLOFT) 100 MG tablet, clonazePAM (KLONOPIN) 0.5 MG tablet  - will try higher dose of zoloft, anticipate less need of klonopin - refilled for now. recheck in 3 months.   Meds ordered this encounter  Medications   sertraline  (ZOLOFT) 100 MG tablet    Sig: Take 1 tablet (100 mg total) by mouth daily.    Dispense:  90 tablet    Refill:  1   clonazePAM (KLONOPIN) 0.5 MG tablet    Sig: Take 0.5-1 tablets (0.25-0.5 mg total) by  mouth 2 (two) times daily as needed for anxiety.    Dispense:  20 tablet    Refill:  0   Patient Instructions   We can try higher dose of Zoloft to see if that helps anxiety further and lessens need for klonopin.  That was refilled for now. Use lowest effective dose and only if needed.  Follow up in 3 months for anxiety - sooner if worse.  I will refer you to podiatry given the size of the lesion on your foot as it may need to be pared or cut down some initially before topical treatment.   Please discuss flexeril refill or other treatments for your back with your back specialist.     If you have lab work done today you will be contacted with your lab results within the next 2 weeks.  If you have not heard from [place] then please contact us. The fastest way to get your results is to register for My Chart.   IF you received an x-ray today, you will receive an invoice from [facility]. Please contact [facility] at [phone] with questions or concerns regarding your invoice.   IF you received labwork today, you will receive an invoice from [facility]. Please contact [facility] at [phone] with questions or concerns regarding your invoice.   Our billing staff will not be able to assist you with questions regarding bills from these companies.  You will be contacted with the lab results as soon as they are available. The fastest way to get your results is to activate your My Chart account. Instructions are located on the last page of this paperwork. If you have not heard from [place] regarding the results in 2 weeks, please contact this office.        Signed,   [name], MD [facility] [place].  02/11/19 8:19 AM

## 2019-02-07 NOTE — Patient Instructions (Addendum)
We can try higher dose of Zoloft to see if that helps anxiety further and lessens need for klonopin.  That was refilled for now. Use lowest effective dose and only if needed.  Follow up in 3 months for anxiety - sooner if worse.  I will refer you to podiatry given the size of the lesion on your foot as it may need to be pared or cut down some initially before topical treatment.   Please discuss flexeril refill or other treatments for your back with your back specialist.     If you have lab work done today you will be contacted with your lab results within the next 2 weeks.  If you have not heard from [place] then please contact us. The fastest way to get your results is to register for My Chart.   IF you received an x-ray today, you will receive an invoice from [facility]. Please contact [facility] at [phone] with questions or concerns regarding your invoice.   IF you received labwork today, you will receive an invoice from [facility]. Please contact [facility] at [phone] with questions or concerns regarding your invoice.   Our billing staff will not be able to assist you with questions regarding bills from these companies.  You will be contacted with the lab results as soon as they are available. The fastest way to get your results is to activate your My Chart account. Instructions are located on the last page of this paperwork. If you have not heard from [place] regarding the results in 2 weeks, please contact this office.

## 2019-02-11 ENCOUNTER — Encounter: Payer: Self-pay | Admitting: Family Medicine

## 2019-03-24 ENCOUNTER — Ambulatory Visit (INDEPENDENT_AMBULATORY_CARE_PROVIDER_SITE_OTHER): Payer: Self-pay | Admitting: Podiatry

## 2019-03-24 ENCOUNTER — Other Ambulatory Visit: Payer: Self-pay

## 2019-03-24 ENCOUNTER — Other Ambulatory Visit: Payer: Self-pay | Admitting: Podiatry

## 2019-03-24 VITALS — Temp 97.1°F

## 2019-03-24 DIAGNOSIS — L989 Disorder of the skin and subcutaneous tissue, unspecified: Secondary | ICD-10-CM

## 2019-03-24 DIAGNOSIS — B079 Viral wart, unspecified: Secondary | ICD-10-CM

## 2019-03-24 DIAGNOSIS — B078 Other viral warts: Secondary | ICD-10-CM

## 2019-03-26 NOTE — Progress Notes (Signed)
Subjective:  Patient ID: Luke Franco, male    DOB: 01-14-1956,  MRN: [id number]  Chief Complaint  Patient presents with  . Foot Problem    Right plantar lesion, painful. 2 year duration.    63 y.o. male presents with the above complaint.  States the lesion has been present for about 2 years and has grown slowly over time.  States that the lesion has about doubled in the past year.  Reports pain with the lesion.  Denies treatments.  Review of Systems: Negative except as noted in the HPI. Denies N/V/F/Ch.  Past Medical History:  Diagnosis Date  . Allergy   . Anxiety     Current Outpatient Medications:  .  albuterol (PROVENTIL HFA;VENTOLIN HFA) 108 (90 Base) MCG/ACT inhaler, Inhale 1-2 puffs into the lungs every 4 (four) hours as needed for wheezing or shortness of breath., Disp: 1 Inhaler, Rfl: 0 .  busPIRone (BUSPAR) 7.5 MG tablet, TAKE 1 TABLET BY MOUTH THREE TIMES DAILY INITIALLY TWICE PER DAY FOR FIRST WEEK, Disp: , Rfl:  .  cetirizine (ZYRTEC) 10 MG tablet, Take 10 mg by mouth daily., Disp: , Rfl:  .  clonazePAM (KLONOPIN) 0.5 MG tablet, Take 0.5-1 tablets (0.25-0.5 mg total) by mouth 2 (two) times daily as needed for anxiety., Disp: 20 tablet, Rfl: 0 .  cyclobenzaprine (FLEXERIL) 5 MG tablet, Take 0.5-1 tablets (2.5-5 mg total) by mouth 3 (three) times daily as needed for muscle spasms., Disp: 20 tablet, Rfl: 0 .  sertraline (ZOLOFT) 100 MG tablet, Take 1 tablet (100 mg total) by mouth daily., Disp: 90 tablet, Rfl: 1  Social History   Tobacco Use  Smoking Status Current Every Day Smoker  . Packs/day: 1.00  . Years: 25.00  . Pack years: 25.00  . Types: Cigarettes  Smokeless Tobacco Never Used    Allergies  Allergen Reactions  . Amoxicillin     vomiting  . Prednisone Nausea And Vomiting   Objective:   Vitals:   03/24/19 1543  Temp: (!) 97.1 F (36.2 C)   There is no height or weight on file to calculate BMI. Constitutional Well developed. Well nourished.   Vascular Dorsalis pedis pulses palpable bilaterally. Posterior tibial pulses palpable bilaterally. Capillary refill normal to all digits.  No cyanosis or clubbing noted. Pedal hair growth normal.  Neurologic Normal speech. Oriented to person, place, and time. Epicritic sensation to light touch grossly present bilaterally.  Dermatologic Nails normal Skin large 1.5 cm coliform appearing lesion to the plantar medial arch of the right foot. Pain to palpation.    Orthopedic: Normal joint ROM without pain or crepitus bilaterally. No visible deformities. No bony tenderness.   Radiographs: None Assessment:   1. Verruca   2. Skin lesion of foot    Plan:  Patient was evaluated and treated and all questions answered.  Atypical Verrucous appearing lesion, Right Foot -Discussed the patient performing a biopsy given large size to rule out malignancy.  Rule out verrucous carcinoma.  -Punch biopsy performed as below.  Risk benefits and alternatives of procedure discussed.  Patient agreed to proceed  Procedure: Punch Biopsy Indication: atypical verrucous lesion Anesthesia: Lidocaine with epinephrine 5 cc Instrumentation: 3 mm punch biopsy Technique: Following sterile skin prep, a punch biopsy was performed with a 3 mm punch the full-thickness punch biopsy was collected for histology. Dressing: Dry, sterile, compression dressing. Specimen: Labeled and sent for pathology. Disposition: Patient tolerated procedure well. Leave dressing on for 48 hours. Thereafter dress with antibiotic ointment and  band-aid. Off-loading pads dispensed. Patient to return in 1 week for follow-up.      No follow-ups on file.

## 2019-03-27 ENCOUNTER — Telehealth: Payer: Self-pay | Admitting: *Deleted

## 2019-03-27 NOTE — Telephone Encounter (Signed)
I informed pt's wife, Luke Franco that pt could soak the foot in a little antibacterial soap and warm water for a few moments until the dressing came off, then gently clean the area and rinse well, pat dry and cover with an antibiotic ointment line neosporin. I told Luke Franco the area should be cleaned daily with soap and warm water, then rinsed and covered with a light coating of neosporin and a bandaid until seen in office.

## 2019-03-27 NOTE — Telephone Encounter (Signed)
Pt's wife, Olin Hauser called states they have removed the dressing from the area the wart had been removed and it is stuck in one area, do they soak it off.

## 2019-03-27 NOTE — Telephone Encounter (Signed)
Noted thanks °

## 2019-03-27 NOTE — Telephone Encounter (Signed)
[facility] - Ivin Booty called for site of specimen collected 03/24/2019. I informed Ivin Booty the biopsy site was right foot.

## 2019-03-28 ENCOUNTER — Telehealth: Payer: Self-pay | Admitting: Podiatry

## 2019-03-28 LAB — TISSUE SPECIMEN

## 2019-03-28 LAB — PATHOLOGY REPORT

## 2019-03-28 NOTE — Telephone Encounter (Signed)
Called patient to discuss biopsy results. Appears to be benign wart. Discussed likely trial of laser therapy vs excision.

## 2019-04-07 ENCOUNTER — Encounter: Payer: Self-pay | Admitting: Podiatry

## 2019-04-07 ENCOUNTER — Ambulatory Visit (INDEPENDENT_AMBULATORY_CARE_PROVIDER_SITE_OTHER): Payer: Self-pay | Admitting: Podiatry

## 2019-04-07 ENCOUNTER — Other Ambulatory Visit: Payer: Self-pay

## 2019-04-07 VITALS — Temp 98.4°F

## 2019-04-07 DIAGNOSIS — B079 Viral wart, unspecified: Secondary | ICD-10-CM

## 2019-04-07 DIAGNOSIS — B078 Other viral warts: Secondary | ICD-10-CM

## 2019-04-07 NOTE — Progress Notes (Signed)
  Subjective:  Patient ID: Luke Franco, male    DOB: 27-May-1956,  MRN: [id number]  Chief Complaint  Patient presents with  . Foot Problem    the spot on the bottom of my right foot is about the same and does drain some and i had a biopsy done last week    63 y.o. male presents with the above complaint.  Has some pain at the lesion intermittently denies new issues.  Review of Systems: Negative except as noted in the HPI. Denies N/V/F/Ch.  Past Medical History:  Diagnosis Date  . Allergy   . Anxiety     Current Outpatient Medications:  .  albuterol (PROVENTIL HFA;VENTOLIN HFA) 108 (90 Base) MCG/ACT inhaler, Inhale 1-2 puffs into the lungs every 4 (four) hours as needed for wheezing or shortness of breath., Disp: 1 Inhaler, Rfl: 0 .  busPIRone (BUSPAR) 7.5 MG tablet, TAKE 1 TABLET BY MOUTH THREE TIMES DAILY INITIALLY TWICE PER DAY FOR FIRST WEEK, Disp: , Rfl:  .  cetirizine (ZYRTEC) 10 MG tablet, Take 10 mg by mouth daily., Disp: , Rfl:  .  clonazePAM (KLONOPIN) 0.5 MG tablet, Take 0.5-1 tablets (0.25-0.5 mg total) by mouth 2 (two) times daily as needed for anxiety., Disp: 20 tablet, Rfl: 0 .  cyclobenzaprine (FLEXERIL) 5 MG tablet, Take 0.5-1 tablets (2.5-5 mg total) by mouth 3 (three) times daily as needed for muscle spasms., Disp: 20 tablet, Rfl: 0 .  sertraline (ZOLOFT) 100 MG tablet, Take 1 tablet (100 mg total) by mouth daily., Disp: 90 tablet, Rfl: 1  Social History   Tobacco Use  Smoking Status Current Every Day Smoker  . Packs/day: 1.00  . Years: 25.00  . Pack years: 25.00  . Types: Cigarettes  Smokeless Tobacco Never Used    Allergies  Allergen Reactions  . Amoxicillin     vomiting  . Prednisone Nausea And Vomiting   Objective:   Vitals:   04/07/19 1453  Temp: 98.4 F (36.9 C)   There is no height or weight on file to calculate BMI. Constitutional Well developed. Well nourished.  Vascular Dorsalis pedis pulses palpable bilaterally. Posterior tibial  pulses palpable bilaterally. Capillary refill normal to all digits.  No cyanosis or clubbing noted. Pedal hair growth normal.  Neurologic Normal speech. Oriented to person, place, and time. Epicritic sensation to light touch grossly present bilaterally.  Dermatologic  large verruca plantar right foot  Orthopedic: Normal joint ROM without pain or crepitus bilaterally. No visible deformities. No bony tenderness.   Radiographs: None Assessment:   1. Verruca    Plan:  Patient was evaluated and treated and all questions answered.  Atypical Verrucous appearing lesion, Right Foot -Path report reviewed consistent with benign verruca.  Wound gently debrided and destroyed with multiple passes of YAG laser.  Procedure: Destruction of Lesion Location: right arch Anesthesia: none Instrumentation: 312 blade, YAG laser Technique: Debridement of lesion , followed by several passes of Yag laser to destroy lesion.   Return in about 2 weeks (around 04/21/2019) for Wart laser .

## 2019-04-21 ENCOUNTER — Ambulatory Visit: Payer: Self-pay | Admitting: Podiatry

## 2019-04-27 ENCOUNTER — Other Ambulatory Visit: Payer: Self-pay

## 2019-04-27 ENCOUNTER — Ambulatory Visit (INDEPENDENT_AMBULATORY_CARE_PROVIDER_SITE_OTHER): Payer: Self-pay | Admitting: Podiatry

## 2019-04-27 DIAGNOSIS — B079 Viral wart, unspecified: Secondary | ICD-10-CM

## 2019-04-27 DIAGNOSIS — B078 Other viral warts: Secondary | ICD-10-CM

## 2019-04-30 NOTE — Progress Notes (Signed)
  Subjective:  Patient ID: Luke Franco, male    DOB: 22-Jun-1956,  MRN: [id number]  Chief Complaint  Patient presents with  . Plantar Warts    Pt states right foot plantar wart is improving with laser treatment. Pt stated no new concerns.    63 y.o. male presents with the above complaint.  Thinks the laser is helping.  Review of Systems: Negative except as noted in the HPI. Denies N/V/F/Ch.  Past Medical History:  Diagnosis Date  . Allergy   . Anxiety     Current Outpatient Medications:  .  albuterol (PROVENTIL HFA;VENTOLIN HFA) 108 (90 Base) MCG/ACT inhaler, Inhale 1-2 puffs into the lungs every 4 (four) hours as needed for wheezing or shortness of breath., Disp: 1 Inhaler, Rfl: 0 .  busPIRone (BUSPAR) 7.5 MG tablet, TAKE 1 TABLET BY MOUTH THREE TIMES DAILY INITIALLY TWICE PER DAY FOR FIRST WEEK, Disp: , Rfl:  .  cetirizine (ZYRTEC) 10 MG tablet, Take 10 mg by mouth daily., Disp: , Rfl:  .  clonazePAM (KLONOPIN) 0.5 MG tablet, Take 0.5-1 tablets (0.25-0.5 mg total) by mouth 2 (two) times daily as needed for anxiety., Disp: 20 tablet, Rfl: 0 .  cyclobenzaprine (FLEXERIL) 5 MG tablet, Take 0.5-1 tablets (2.5-5 mg total) by mouth 3 (three) times daily as needed for muscle spasms., Disp: 20 tablet, Rfl: 0 .  sertraline (ZOLOFT) 100 MG tablet, Take 1 tablet (100 mg total) by mouth daily., Disp: 90 tablet, Rfl: 1  Social History   Tobacco Use  Smoking Status Current Every Day Smoker  . Packs/day: 1.00  . Years: 25.00  . Pack years: 25.00  . Types: Cigarettes  Smokeless Tobacco Never Used    Allergies  Allergen Reactions  . Amoxicillin     vomiting  . Prednisone Nausea And Vomiting   Objective:   There were no vitals filed for this visit. There is no height or weight on file to calculate BMI. Constitutional Well developed. Well nourished.  Vascular Dorsalis pedis pulses palpable bilaterally. Posterior tibial pulses palpable bilaterally. Capillary refill normal to all  digits.  No cyanosis or clubbing noted. Pedal hair growth normal.  Neurologic Normal speech. Oriented to person, place, and time. Epicritic sensation to light touch grossly present bilaterally.  Dermatologic  large verruca plantar right foot  Orthopedic: Normal joint ROM without pain or crepitus bilaterally. No visible deformities. No bony tenderness.   Radiographs: None Assessment:   1. Verruca    Plan:  Patient was evaluated and treated and all questions answered.  Atypical Verrucous appearing lesion, Right Foot -Lesion debrided and destroyed again as below  Procedure: Destruction of Lesion Location: right foot Anesthesia: none Instrumentation: 15 blade, YAG laser Technique: Debridement of lesion , followed by several passes of Yag laser to destroy lesion.    No follow-ups on file.

## 2019-05-08 ENCOUNTER — Telehealth: Payer: Self-pay | Admitting: Family Medicine

## 2019-05-08 DIAGNOSIS — F411 Generalized anxiety disorder: Secondary | ICD-10-CM

## 2019-05-08 DIAGNOSIS — M5442 Lumbago with sciatica, left side: Secondary | ICD-10-CM

## 2019-05-08 NOTE — Telephone Encounter (Addendum)
Patient [facility] follow up appointment with PCP for 06/07/2019 due to patient not having no insurance, patient would like PCP to refill clonazePAM (KLONOPIN) 1 MG tablet to hold him over, please advise   [place], [place] - [place] [phone] (Phone) [phone] (Fax)

## 2019-05-10 ENCOUNTER — Ambulatory Visit: Payer: Self-pay | Admitting: Family Medicine

## 2019-05-17 MED ORDER — CLONAZEPAM 0.5 MG PO TABS: 0.2500 mg | ORAL_TABLET | Freq: Two times a day (BID) | ORAL | 0 refills | Status: DC | PRN

## 2019-05-17 NOTE — Telephone Encounter (Signed)
Last filled in June. Will authorize refill at this time until can reschedule.  Controlled substance database (PDMP) reviewed. No concerns appreciated.

## 2019-05-25 ENCOUNTER — Ambulatory Visit: Payer: Self-pay | Admitting: Podiatry

## 2019-06-01 ENCOUNTER — Ambulatory Visit (INDEPENDENT_AMBULATORY_CARE_PROVIDER_SITE_OTHER): Payer: Self-pay | Admitting: Podiatry

## 2019-06-01 ENCOUNTER — Other Ambulatory Visit: Payer: Self-pay

## 2019-06-01 DIAGNOSIS — B078 Other viral warts: Secondary | ICD-10-CM

## 2019-06-01 DIAGNOSIS — B079 Viral wart, unspecified: Secondary | ICD-10-CM

## 2019-06-05 ENCOUNTER — Telehealth: Payer: Self-pay | Admitting: Family Medicine

## 2019-06-05 NOTE — Telephone Encounter (Signed)
Pt doing well and would like pcp to know that he is grateful for the med refill and will reschedule for next month due to lack of insurance

## 2019-06-07 ENCOUNTER — Ambulatory Visit: Payer: Self-pay | Admitting: Family Medicine

## 2019-06-23 ENCOUNTER — Ambulatory Visit: Payer: Self-pay | Admitting: Podiatry

## 2019-07-06 ENCOUNTER — Ambulatory Visit: Payer: Self-pay | Admitting: Podiatry

## 2019-07-06 ENCOUNTER — Other Ambulatory Visit: Payer: Self-pay

## 2019-07-06 DIAGNOSIS — B079 Viral wart, unspecified: Secondary | ICD-10-CM

## 2019-07-06 DIAGNOSIS — B078 Other viral warts: Secondary | ICD-10-CM

## 2019-08-04 ENCOUNTER — Ambulatory Visit: Payer: Self-pay | Admitting: Podiatry

## 2019-08-28 ENCOUNTER — Telehealth (INDEPENDENT_AMBULATORY_CARE_PROVIDER_SITE_OTHER): Payer: Self-pay | Admitting: Registered Nurse

## 2019-08-28 ENCOUNTER — Other Ambulatory Visit: Payer: Self-pay

## 2019-08-28 ENCOUNTER — Encounter: Payer: Self-pay | Admitting: Registered Nurse

## 2019-08-28 DIAGNOSIS — Z539 Procedure and treatment not carried out, unspecified reason: Secondary | ICD-10-CM

## 2019-08-28 NOTE — Progress Notes (Signed)
Patient stated he has been congested , a bad cough, a fever at times. Had a negative COVID test two days ago and need some medicine.

## 2019-08-29 NOTE — Progress Notes (Signed)
Pt had to be converted to telemed, unfortunately experienced technical difficulties with his phone and could not be seen.  Kathrin Ruddy, NP

## 2019-08-30 ENCOUNTER — Other Ambulatory Visit: Payer: Self-pay

## 2019-08-30 ENCOUNTER — Telehealth: Payer: 59 | Admitting: Family Medicine

## 2019-09-04 ENCOUNTER — Telehealth (INDEPENDENT_AMBULATORY_CARE_PROVIDER_SITE_OTHER): Payer: 59 | Admitting: Family Medicine

## 2019-09-04 ENCOUNTER — Other Ambulatory Visit: Payer: Self-pay

## 2019-09-04 DIAGNOSIS — R062 Wheezing: Secondary | ICD-10-CM

## 2019-09-04 DIAGNOSIS — F411 Generalized anxiety disorder: Secondary | ICD-10-CM

## 2019-09-04 DIAGNOSIS — R05 Cough: Secondary | ICD-10-CM

## 2019-09-04 DIAGNOSIS — R0600 Dyspnea, unspecified: Secondary | ICD-10-CM

## 2019-09-04 DIAGNOSIS — R059 Cough, unspecified: Secondary | ICD-10-CM

## 2019-09-04 DIAGNOSIS — J22 Unspecified acute lower respiratory infection: Secondary | ICD-10-CM

## 2019-09-04 DIAGNOSIS — Z720 Tobacco use: Secondary | ICD-10-CM

## 2019-09-04 MED ORDER — SERTRALINE HCL 50 MG PO TABS: 50.0000 mg | ORAL_TABLET | Freq: Every day | ORAL | 1 refills | Status: DC

## 2019-09-04 MED ORDER — BENZONATATE 100 MG PO CAPS: 100.0000 mg | ORAL_CAPSULE | Freq: Three times a day (TID) | ORAL | 0 refills | Status: DC | PRN

## 2019-09-04 MED ORDER — ALBUTEROL SULFATE HFA 108 (90 BASE) MCG/ACT IN AERS: 1.0000 | INHALATION_SPRAY | RESPIRATORY_TRACT | 0 refills | Status: AC | PRN

## 2019-09-04 MED ORDER — AZITHROMYCIN 250 MG PO TABS: ORAL_TABLET | ORAL | 0 refills | Status: DC

## 2019-09-04 MED ORDER — CLONAZEPAM 0.5 MG PO TABS: 0.2500 mg | ORAL_TABLET | Freq: Two times a day (BID) | ORAL | 0 refills | Status: DC | PRN

## 2019-09-04 NOTE — Progress Notes (Signed)
  Subjective:  Patient ID: Luke Franco, male    DOB: 08/24/1956,  MRN: [id number]  Chief Complaint  Patient presents with  . Plantar Warts    Pt states laser wart treatment has been effective.    64 y.o. male presents with the above complaint. States the lesion is doing much better.  Review of Systems: Negative except as noted in the HPI. Denies N/V/F/Ch.  Past Medical History:  Diagnosis Date  . Allergy   . Anxiety     Current Outpatient Medications:  .  albuterol (PROVENTIL HFA;VENTOLIN HFA) 108 (90 Base) MCG/ACT inhaler, Inhale 1-2 puffs into the lungs every 4 (four) hours as needed for wheezing or shortness of breath., Disp: 1 Inhaler, Rfl: 0 .  busPIRone (BUSPAR) 7.5 MG tablet, TAKE 1 TABLET BY MOUTH THREE TIMES DAILY INITIALLY TWICE PER DAY FOR FIRST WEEK, Disp: , Rfl:  .  cetirizine (ZYRTEC) 10 MG tablet, Take 10 mg by mouth daily., Disp: , Rfl:  .  clonazePAM (KLONOPIN) 0.5 MG tablet, Take 0.5-1 tablets (0.25-0.5 mg total) by mouth 2 (two) times daily as needed for anxiety., Disp: 20 tablet, Rfl: 0 .  cyclobenzaprine (FLEXERIL) 5 MG tablet, Take 0.5-1 tablets (2.5-5 mg total) by mouth 3 (three) times daily as needed for muscle spasms., Disp: 20 tablet, Rfl: 0 .  sertraline (ZOLOFT) 100 MG tablet, Take 1 tablet (100 mg total) by mouth daily., Disp: 90 tablet, Rfl: 1  Social History   Tobacco Use  Smoking Status Current Every Day Smoker  . Packs/day: 1.00  . Years: 25.00  . Pack years: 25.00  . Types: Cigarettes  Smokeless Tobacco Never Used    Allergies  Allergen Reactions  . Amoxicillin     vomiting  . Prednisone Nausea And Vomiting   Objective:   There were no vitals filed for this visit. There is no height or weight on file to calculate BMI. Constitutional Well developed. Well nourished.  Vascular Dorsalis pedis pulses palpable bilaterally. Posterior tibial pulses palpable bilaterally. Capillary refill normal to all digits.  No cyanosis or clubbing  noted. Pedal hair growth normal.  Neurologic Normal speech. Oriented to person, place, and time. Epicritic sensation to light touch grossly present bilaterally.  Dermatologic  large verruca plantar right foot  Orthopedic: Normal joint ROM without pain or crepitus bilaterally. No visible deformities. No bony tenderness.   Radiographs: None Assessment:   1. Verruca    Plan:  Patient was evaluated and treated and all questions answered.  Atypical Verrucous appearing lesion, Right Foot -Lesion improving, almost resolved.  Return in about 1 month (around 08/05/2019) for wart f/u.

## 2019-09-04 NOTE — Progress Notes (Signed)
Virtual Visit via Telephone Note  I connected with Luke Franco on 09/04/19 at 6:55 PM by telephone and verified that I am speaking with the correct person using two identifiers.   I discussed the limitations, risks, security and privacy concerns of performing an evaluation and management service by telephone and the availability of in person appointments. I also discussed with the patient that there may be a patient responsible charge related to this service. The patient expressed understanding and agreed to proceed, consent obtained.   Chief complaint: Generalized anxiety disorder.   Chief Complaint  Patient presents with  . Cough    dry coungh, pt is congested with pressure in his sinuses.   . Medication Refill    on pts clonazePAM.      History of Present Illness:  Generalized anxiety disorder: Last discussed in June 2020. Tried BuSpar, did not tolerate.  Significant insomnia, clammy sweats.  Started on Zoloft 50 mg daily, improving at that time with Klonopin 1/4 to 1 pill 3 times per week.  Trial of higher dose of Zoloft at 100 mg daily.  Plan for 22-month follow-up. Controlled substance database (PDMP) reviewed. No concerns appreciated.  Clonazepam 0.5 mg last filled for #20 on May 18, 2019.  Taking sertraline - felt like 100mg  dose was causing more sedation. 50 mg worked better, not quite as tired.  Taking klonopin 1/4-1/2 at night. Sometimes small dose in am. Out of work. Anxious about not being able to work due to back pain.   Cough: Cough, congestion. Started on 12/31.  Nasal congestion, cough, pressure behind eyes.  T99.4.  Some sinus HA, no bodyaches. Frontal headache.  Some loss of taste, no loss of smell.  Has some shortness of breath. Slight diarrhea, vomiting once at physical therapy and 2 times when he got home - 12/31.  Persistent nausea, but no return of vomiting.  Still having cough and headache, some nasal congestion, shortness of breath, but  overall slightly better past days.  Has used albuterol in morning few times per day - some increased use past few days. Some persistent phlegm - yellow-green.  Tobacco user - under 1 pack per day.  covid test on initial day of symptoms- rapid test, and told was negative.  Sister in law had negative test. Other family members ok.  No confusion, no dyspnea at rest.   Slightly less symptoms last few days. Less nausea. Still with cough and headache.  Tessalon helped in past.  Some n/v with prednisone in past.  Planned pt appt tomorrow.    Patient Active Problem List   Diagnosis Date Noted  . Chronic myofascial pain 02/02/2019  . Chronic pain disorder 02/02/2019  . Lumbar degenerative disc disease 02/02/2019  . Lumbar foraminal stenosis 02/02/2019  . Other spondylosis with radiculopathy, lumbar region 02/02/2019  . Thoracic spondylosis without myelopathy 02/02/2019  . Low back pain radiating to left lower extremity 04/07/2018  . Tobacco abuse 06/30/2013   Past Medical History:  Diagnosis Date  . Allergy   . Anxiety    Past Surgical History:  Procedure Laterality Date  . KIDNEY STONE SURGERY     Allergies  Allergen Reactions  . Amoxicillin     vomiting  . Prednisone Nausea And Vomiting   Prior to Admission medications   Medication Sig Start Date End Date Taking? Authorizing Provider  albuterol (PROVENTIL HFA;VENTOLIN HFA) 108 (90 Base) MCG/ACT inhaler Inhale 1-2 puffs into the lungs every 4 (four) hours as needed for wheezing  or shortness of breath. 10/25/18  Yes Wendie Agreste, MD  cetirizine (ZYRTEC) 10 MG tablet Take 10 mg by mouth daily.   Yes [provider]  clonazePAM (KLONOPIN) 0.5 MG tablet Take 0.5-1 tablets (0.25-0.5 mg total) by mouth 2 (two) times daily as needed for anxiety. 05/17/19  Yes Wendie Agreste, MD  cyclobenzaprine (FLEXERIL) 5 MG tablet Take 0.5-1 tablets (2.5-5 mg total) by mouth 3 (three) times daily as needed for muscle spasms. 09/01/18  Yes  Wendie Agreste, MD  sertraline (ZOLOFT) 100 MG tablet Take 1 tablet (100 mg total) by mouth daily. 02/07/19  Yes Wendie Agreste, MD  busPIRone (BUSPAR) 7.5 MG tablet TAKE 1 TABLET BY MOUTH THREE TIMES DAILY INITIALLY TWICE PER DAY FOR FIRST WEEK 11/21/18   [provider]   Social History   Socioeconomic History  . Marital status: Married    Spouse name: Not on file  . Number of children: 1  . Years of education: Not on file  . Highest education level: Not on file  Occupational History  . Occupation: pipe fitter  Tobacco Use  . Smoking status: Current Every Day Smoker    Packs/day: 1.00    Years: 25.00    Pack years: 25.00    Types: Cigarettes  . Smokeless tobacco: Never Used  Substance and Sexual Activity  . Alcohol use: No    Alcohol/week: 0.0 standard drinks  . Drug use: No  . Sexual activity: Not on file  Other Topics Concern  . Not on file  Social History Narrative  . Not on file   Social Determinants of Health   Financial Resource Strain:   . Difficulty of Paying Living Expenses: Not on file  Food Insecurity:   . Worried About Charity fundraiser in the Last Year: Not on file  . Ran Out of Food in the Last Year: Not on file  Transportation Needs:   . Lack of Transportation (Medical): Not on file  . Lack of Transportation (Non-Medical): Not on file  Physical Activity:   . Days of Exercise per Week: Not on file  . Minutes of Exercise per Session: Not on file  Stress:   . Feeling of Stress : Not on file  Social Connections:   . Frequency of Communication with Friends and Family: Not on file  . Frequency of Social Gatherings with Friends and Family: Not on file  . Attends Religious Services: Not on file  . Active Member of Clubs or Organizations: Not on file  . Attends Archivist Meetings: Not on file  . Marital Status: Not on file  Intimate Partner Violence:   . Fear of Current or Ex-Partner: Not on file  . Emotionally Abused: Not on  file  . Physically Abused: Not on file  . Sexually Abused: Not on file     Observations/Objective: Appropriate responses, no distress, speaking in full sentences.  Few coughs during visit, no audible wheeze over phone.  All questions answered with understanding of plan expressed.  Assessment and Plan: GAD (generalized anxiety disorder) - Plan: clonazePAM (KLONOPIN) 0.5 MG tablet, sertraline (ZOLOFT) 50 MG tablet  -Stable on 50 mg dosing, Klonopin low-dose in the evening, occasionally morning.  Plan on eventual tapering of Klonopin, handout given for managing anxiety, recheck in 6 months.  Cough - Plan: benzonatate (TESSALON) 100 MG capsule, Novel Coronavirus, NAA ([facility]), albuterol (VENTOLIN HFA) 108 (90 Base) MCG/ACT inhaler Dyspnea, unspecified type - Plan: Novel Coronavirus, NAA ([facility]) Tobacco  abuse LRTI (lower respiratory tract infection) - Plan: azithromycin (ZITHROMAX) 250 MG tablet Wheezing - Plan: albuterol (VENTOLIN HFA) 108 (90 Base) MCG/ACT inhaler  -Differential still includes possible COVID-19 with initial test being on day 1 of symptoms and rapid test.  Overall some slight improvement, but persistent cough, dyspnea, headache.  Denies dyspnea at rest, does not appear to need emergent evaluation at this time.  -Covid test ordered, as results will likely be needed prior to return to physical therapy, or at least to determine timing as symptoms improve.  -Start azithromycin, has not tolerated doxycycline in the past.  Also hold on prednisone given previous intolerance.  -Tessalon Perles, albuterol prescribed with RTC/ER precautions given.  Recheck in 2 days by virtual visit to decide if respiratory clinic eval needed.  ER/urgent care precautions in the meantime   Follow Up Instructions: 2 days telemed.    I discussed the assessment and treatment plan with the patient. The patient was provided an opportunity to ask questions and all were answered. The patient agreed with the  plan and demonstrated an understanding of the instructions.   The patient was advised to call back or seek an in-person evaluation if the symptoms worsen or if the condition fails to improve as anticipated.  I provided  20 minutes of non-face-to-face time during this encounter.  Signed,   [name], MD [facility].  09/04/19

## 2019-09-04 NOTE — Patient Instructions (Addendum)
OK to continue same dose sertraline at 50mg  per day. 6 pills left.  Try melatonin at bedtime.  Ok to continue klonopin for now. Lowest effective dose and recheck in 6 months. Plan to try weaning that medicine at that time. See information below on anxiety.    Cough, congestion could still be related to COVID-19 as your initial test could have been a false negative. Start azithromycin for possible secondary bacterial infection, or flare of lung issue with your smoking history.  Tessalon Perles if needed for cough, albuterol if needed for wheezing.  I did not start prednisone due to your side effects in the past, but will need to evaluate you again in 2 days to make sure that medicine will not be needed.  If any increasing shortness of breath, confusion, difficulty with drinking fluids, or other acute worsening symptoms be seen in the emergency room or urgent care.      Managing Anxiety, Adult After being diagnosed with an anxiety disorder, you may be relieved to know why you have felt or behaved a certain way. You may also feel overwhelmed about the treatment ahead and what it will mean for your life. With care and support, you can manage this condition and recover from it. How to manage lifestyle changes Managing stress and anxiety  Stress is your body's reaction to life changes and events, both good and bad. Most stress will last just a few hours, but stress can be ongoing and can lead to more than just stress. Although stress can play a major role in anxiety, it is not the same as anxiety. Stress is usually caused by something external, such as a deadline, test, or competition. Stress normally passes after the triggering event has ended.  Anxiety is caused by something internal, such as imagining a terrible outcome or worrying that something will go wrong that will devastate you. Anxiety often does not go away even after the triggering event is over, and it can become long-term (chronic) worry.  It is important to understand the differences between stress and anxiety and to manage your stress effectively so that it does not lead to an anxious response. Talk with your health care provider or a counselor to learn more about reducing anxiety and stress. He or she may suggest tension reduction techniques, such as:  Music therapy. This can include creating or listening to music that you enjoy and that inspires you.  Mindfulness-based meditation. This involves being aware of your normal breaths while not trying to control your breathing. It can be done while sitting or walking.  Centering prayer. This involves focusing on a word, phrase, or sacred image that means something to you and brings you peace.  Deep breathing. To do this, expand your stomach and inhale slowly through your nose. Hold your breath for 3-5 seconds. Then exhale slowly, letting your stomach muscles relax.  Self-talk. This involves identifying thought patterns that lead to anxiety reactions and changing those patterns.  Muscle relaxation. This involves tensing muscles and then relaxing them. Choose a tension reduction technique that suits your lifestyle and personality. These techniques take time and practice. Set aside 5-15 minutes a day to do them. Therapists can offer counseling and training in these techniques. The training to help with anxiety may be covered by some insurance plans. Other things you can do to manage stress and anxiety include:  Keeping a stress/anxiety diary. This can help you learn what triggers your reaction and then learn ways to manage your  response.  Thinking about how you react to certain situations. You may not be able to control everything, but you can control your response.  Making time for activities that help you relax and not feeling guilty about spending your time in this way.  Visual imagery and yoga can help you stay calm and relax.  Medicines Medicines can help ease symptoms.  Medicines for anxiety include:  Anti-anxiety drugs.  Antidepressants. Medicines are often used as a primary treatment for anxiety disorder. Medicines will be prescribed by a health care provider. When used together, medicines, psychotherapy, and tension reduction techniques may be the most effective treatment. Relationships Relationships can play a big part in helping you recover. Try to spend more time connecting with trusted friends and family members. Consider going to couples counseling, taking family education classes, or going to family therapy. Therapy can help you and others better understand your condition. How to recognize changes in your anxiety Everyone responds differently to treatment for anxiety. Recovery from anxiety happens when symptoms decrease and stop interfering with your daily activities at home or work. This may mean that you will start to:  Have better concentration and focus. Worry will interfere less in your daily thinking.  Sleep better.  Be less irritable.  Have more energy.  Have improved memory. It is important to recognize when your condition is getting worse. Contact your health care provider if your symptoms interfere with home or work and you feel like your condition is not improving. Follow these instructions at home: Activity  Exercise. Most adults should do the following: ? Exercise for at least 150 minutes each week. The exercise should increase your heart rate and make you sweat (moderate-intensity exercise). ? Strengthening exercises at least twice a week.  Get the right amount and quality of sleep. Most adults need 7-9 hours of sleep each night. Lifestyle   Eat a healthy diet that includes plenty of vegetables, fruits, whole grains, low-fat dairy products, and lean protein. Do not eat a lot of foods that are high in solid fats, added sugars, or salt.  Make choices that simplify your life.  Do not use any products that contain nicotine or  tobacco, such as cigarettes, e-cigarettes, and chewing tobacco. If you need help quitting, ask your health care provider.  Avoid caffeine, alcohol, and certain over-the-counter cold medicines. These may make you feel worse. Ask your pharmacist which medicines to avoid. General instructions  Take over-the-counter and prescription medicines only as told by your health care provider.  Keep all follow-up visits as told by your health care provider. This is important. Where to find support You can get help and support from these sources:  Self-help groups.  Online and [employer].  A trusted spiritual leader.  Couples counseling.  Family education classes.  Family therapy. Where to find more information You may find that joining a support group helps you deal with your anxiety. The following sources can help you locate counselors or support groups near you:  [place]: www.mentalhealth[place].net  Anxiety and Depression Association of [place] (ADAA): https://www.clark.net/  National Alliance on Mental Illness (NAMI): www.nami.org Contact a health care provider if you:  Have a hard time staying focused or finishing daily tasks.  Spend many hours a day feeling worried about everyday life.  Become exhausted by worry.  Start to have headaches, feel tense, or have nausea.  Urinate more than normal.  Have diarrhea. Get help right away if you have:  A racing heart and  shortness of breath.  Thoughts of hurting yourself or others. If you ever feel like you may hurt yourself or others, or have thoughts about taking your own life, get help right away. You can go to your nearest emergency department or call:  Your local emergency services (911 in the [place]  A suicide crisis helpline, such as the [facility] at 719-869-1153. This is open 24 hours a day. Summary  Taking steps to learn and use tension reduction techniques can help calm you  and help prevent triggering an anxiety reaction.  When used together, medicines, psychotherapy, and tension reduction techniques may be the most effective treatment.  Family, friends, and partners can play a big part in helping you recover from an anxiety disorder. This information is not intended to replace advice given to you by your health care provider. Make sure you discuss any questions you have with your health care provider. Document Revised: 01/10/2019 Document Reviewed: 01/10/2019 Elsevier Patient Education  [employer].

## 2019-09-06 ENCOUNTER — Ambulatory Visit: Payer: Self-pay | Attending: Internal Medicine

## 2019-09-06 DIAGNOSIS — Z20822 Contact with and (suspected) exposure to covid-19: Secondary | ICD-10-CM | POA: Insufficient documentation

## 2019-09-06 NOTE — Progress Notes (Signed)
Pt had telemed scheduled for 09/04/19

## 2019-09-07 LAB — NOVEL CORONAVIRUS, NAA: SARS-CoV-2, NAA: NOT DETECTED

## 2019-12-20 ENCOUNTER — Ambulatory Visit (INDEPENDENT_AMBULATORY_CARE_PROVIDER_SITE_OTHER): Payer: Medicare Other | Admitting: Family Medicine

## 2019-12-20 ENCOUNTER — Encounter: Payer: Self-pay | Admitting: Family Medicine

## 2019-12-20 ENCOUNTER — Other Ambulatory Visit: Payer: Self-pay

## 2019-12-20 VITALS — BP 138/74 | HR 81 | Temp 98.7°F | Resp 14 | Ht 70.0 in | Wt 155.4 lb

## 2019-12-20 DIAGNOSIS — N529 Male erectile dysfunction, unspecified: Secondary | ICD-10-CM | POA: Diagnosis not present

## 2019-12-20 DIAGNOSIS — F411 Generalized anxiety disorder: Secondary | ICD-10-CM | POA: Diagnosis not present

## 2019-12-20 MED ORDER — CLONAZEPAM 0.5 MG PO TABS: 0.2500 mg | ORAL_TABLET | Freq: Two times a day (BID) | ORAL | 0 refills | Status: DC | PRN

## 2019-12-20 MED ORDER — SERTRALINE HCL 50 MG PO TABS: 50.0000 mg | ORAL_TABLET | Freq: Every day | ORAL | 1 refills | Status: DC

## 2019-12-20 MED ORDER — SILDENAFIL CITRATE 100 MG PO TABS: 50.0000 mg | ORAL_TABLET | Freq: Every day | ORAL | 11 refills | Status: DC | PRN

## 2019-12-20 NOTE — Progress Notes (Signed)
Subjective:  Patient ID: Luke Franco, male    DOB: 1956/04/05  Age: 64 y.o. MRN: [id number]  CC:  Chief Complaint  Patient presents with  . Medication Refill    pt would like a refill of klonapin, pt would also like to discuss a personal matter     HPI Luke Franco presents for   Generalized anxiety disorder.   Last discussed in January.  Did not tolerate BuSpar.  Previously treated with Zoloft 50 mg daily, then increase to 100 mg daily.  Had a milligrams caused too much sedation, 50 mg worked better.  Was still taking Klonopin 1/4 to 1/2 pill at night January visit.  Continue on Klonopin at that time with other situational stressors, handout given anxiety with plan tapering of Klonopin discussed in the future.  He feels like anxiety is overall been doing better.  Has completed his previous treatment for back pain.  Plans to retire.  Declines need for counseling at this time as he feels like he is handling things fairly well.  Taking Zoloft 50 mg daily, was too groggy to 100 mg.  Takes half Klonopin daily, approximately once per month,  will take a full pill when more anxious.  Later in visit - states he takes klonopin a few days per week.  Controlled substance database (PDMP) reviewed. No concerns appreciated.  Clonazepam 0.5 mg #20 on 09/05/2019   Depression screen [facility] 2/9 12/20/2019 11/07/2018 10/25/2018 09/01/2018 06/16/2018  Decreased Interest 0 0 0 0 0  Down, Depressed, Hopeless 0 0 0 0 0  PHQ - 2 Score 0 0 0 0 0      Erectile dysfunction: Has treated in the past with Cialis or Viagra, not sure which one works better.  Has noticed the past few months some difficulty with erections with spouse.  Denies any social/marital concerns otherwise, just some anxiety with the dysfunction.  Denies any headache, flushing or other side effects with Viagra when used previously.  Noticed erectile dysfunction more past 2 to 3 months.  No saddle anesthesia, no leg weakness.   History Patient  Active Problem List   Diagnosis Date Noted  . Chronic myofascial pain 02/02/2019  . Chronic pain disorder 02/02/2019  . Lumbar degenerative disc disease 02/02/2019  . Lumbar foraminal stenosis 02/02/2019  . Other spondylosis with radiculopathy, lumbar region 02/02/2019  . Thoracic spondylosis without myelopathy 02/02/2019  . Low back pain radiating to left lower extremity 04/07/2018  . Tobacco abuse 06/30/2013   Past Medical History:  Diagnosis Date  . Allergy   . Anxiety    Past Surgical History:  Procedure Laterality Date  . KIDNEY STONE SURGERY     Allergies  Allergen Reactions  . Amoxicillin     vomiting  . Prednisone Nausea And Vomiting   Prior to Admission medications   Medication Sig Start Date End Date Taking? Authorizing Provider  albuterol (VENTOLIN HFA) 108 (90 Base) MCG/ACT inhaler Inhale 1-2 puffs into the lungs every 4 (four) hours as needed for wheezing or shortness of breath. 09/04/19  Yes Wendie Agreste, MD  azithromycin (ZITHROMAX) 250 MG tablet Take 2 pills by mouth on day 1, then 1 pill by mouth per day on days 2 through 5. 09/04/19  Yes Wendie Agreste, MD  benzonatate (TESSALON) 100 MG capsule Take 1 capsule (100 mg total) by mouth 3 (three) times daily as needed for cough. 09/04/19  Yes Wendie Agreste, MD  busPIRone (BUSPAR) 7.5 MG tablet TAKE 1  TABLET BY MOUTH THREE TIMES DAILY INITIALLY TWICE PER DAY FOR FIRST WEEK 11/21/18  Yes [provider]  cetirizine (ZYRTEC) 10 MG tablet Take 10 mg by mouth daily.   Yes [provider]  clonazePAM (KLONOPIN) 0.5 MG tablet Take 0.5-1 tablets (0.25-0.5 mg total) by mouth 2 (two) times daily as needed for anxiety. 09/04/19  Yes Wendie Agreste, MD  cyclobenzaprine (FLEXERIL) 5 MG tablet Take 0.5-1 :  (2.5-5 mg total) by mouth 3 (three) times daily as needed for muscle spasms. 09/01/18  Yes Wendie Agreste, MD  sertraline (ZOLOFT) 50 MG tablet Take 1 tablet (50 mg total) by mouth daily. 09/04/19   Yes Wendie Agreste, MD   Social History   Socioeconomic History  . Marital status: Married    Spouse name: Not on file  . Number of children: 1  . Years of education: Not on file  . Highest education level: Not on file  Occupational History  . Occupation: pipe fitter  Tobacco Use  . Smoking status: Current Every Day Smoker    Packs/day: 1.00    Years: 25.00    Pack years: 25.00    Types: Cigarettes  . Smokeless tobacco: Never Used  Substance and Sexual Activity  . Alcohol use: No    Alcohol/week: 0.0 standard drinks  . Drug use: No  . Sexual activity: Not on file  Other Topics Concern  . Not on file  Social History Narrative  . Not on file   Social Determinants of Health   Financial Resource Strain:   . Difficulty of Paying Living Expenses:   Food Insecurity:   . Worried About Charity fundraiser in the Last Year:   . Arboriculturist in the Last Year:   Transportation Needs:   . Film/video editor (Medical):   Marland Kitchen Lack of Transportation (Non-Medical):   Physical Activity:   . Days of Exercise per Week:   . Minutes of Exercise per Session:   Stress:   . Feeling of Stress :   Social Connections:   . Frequency of Communication with Friends and Family:   . Frequency of Social Gatherings with Friends and Family:   . Attends Religious Services:   . Active Member of Clubs or Organizations:   . Attends Archivist Meetings:   Marland Kitchen Marital Status:   Intimate Partner Violence:   . Fear of Current or Ex-Partner:   . Emotionally Abused:   Marland Kitchen Physically Abused:   . Sexually Abused:     Review of Systems Per HPI.   Objective:   Vitals:   12/20/19 1420 12/20/19 1423  BP: (!) 156/73 138/74  Pulse: 81   Resp: 14   Temp: 98.7 F (37.1 C)   TempSrc: Temporal   SpO2: 96%   Weight: 155 lb 6.4 oz (70.5 kg)   Height: 5\' 10"  (1.778 m)      Physical Exam Constitutional:      General: He is not in acute distress.    Appearance: He is well-developed.    HENT:     Head: Normocephalic and atraumatic.  Cardiovascular:     Rate and Rhythm: Normal rate.  Pulmonary:     Effort: Pulmonary effort is normal.  Neurological:     Mental Status: He is alert and oriented to person, place, and time.  Psychiatric:        Mood and Affect: Mood normal.        Behavior: Behavior normal.  Thought Content: Thought content normal.        Judgment: Judgment normal.        Assessment & Plan:  WICK MOSELY is a 64 y.o. male . GAD (generalized anxiety disorder) - Plan: sertraline (ZOLOFT) 50 MG tablet, clonazePAM (KLONOPIN) 0.5 MG tablet  -stable. Continue zoloft same dose, klonopin as needed. Not using daily - ok to continue to decrease dosing as tolerated - tapering down total dosage slowly. Recheck in 3 months.   Erectile dysfunction, unspecified erectile dysfunction type - Plan: sildenafil (VIAGRA) 100 MG tablet  - viagra Rx given - use lowest effective dose. Side effects discussed (including but not limited to headache/flushing, blue discoloration of vision, possible vascular steal and risk of cardiac effects if underlying unknown coronary artery disease, and permanent sensorineural hearing loss). Understanding expressed.   Meds ordered this encounter  Medications  . sildenafil (VIAGRA) 100 MG tablet    Sig: Take 0.5-1 tablets (50-100 mg total) by mouth daily as needed for erectile dysfunction.    Dispense:  5 tablet    Refill:  11  . sertraline (ZOLOFT) 50 MG tablet    Sig: Take 1 tablet (50 mg total) by mouth daily.    Dispense:  90 tablet    Refill:  1  . clonazePAM (KLONOPIN) 0.5 MG tablet    Sig: Take 0.5 tablets (0.25 mg total) by mouth 2 (two) times daily as needed for anxiety.    Dispense:  20 tablet    Refill:  0   Patient Instructions    Use lowest effective dose of Viagra.  If any new side effects as we discussed, stop and be seen right away.  Okay to continue on same dose of sertraline for now, Klonopin can be used  as needed at 1/2 dose - can skip a few days per week if well controlled anxiety. recheck in 3 months.   Return to the clinic or go to the nearest emergency room if any of your symptoms worsen or new symptoms occur.   If you have lab work done today you will be contacted with your lab results within the next 2 weeks.  If you have not heard from [place] then please contact us. The fastest way to get your results is to register for My Chart.   IF you received an x-ray today, you will receive an invoice from [facility]. Please contact [facility] at [phone] with questions or concerns regarding your invoice.   IF you received labwork today, you will receive an invoice from [place]. Please contact [facility] at [phone] with questions or concerns regarding your invoice.   Our billing staff will not be able to assist you with questions regarding bills from these companies.  You will be contacted with the lab results as soon as they are available. The fastest way to get your results is to activate your My Chart account. Instructions are located on the last page of this paperwork. If you have not heard from [place] regarding the results in 2 weeks, please contact this office.         Signed, [name], MD [facility]

## 2019-12-20 NOTE — Patient Instructions (Addendum)
  Use lowest effective dose of Viagra.  If any new side effects as we discussed, stop and be seen right away.  Okay to continue on same dose of sertraline for now, Klonopin can be used as needed at 1/2 dose - can skip a few days per week if well controlled anxiety. recheck in 3 months.   Return to the clinic or go to the nearest emergency room if any of your symptoms worsen or new symptoms occur.   If you have lab work done today you will be contacted with your lab results within the next 2 weeks.  If you have not heard from [place] then please contact us. The fastest way to get your results is to register for My Chart.   IF you received an x-ray today, you will receive an invoice from [facility]. Please contact [facility] at [phone] with questions or concerns regarding your invoice.   IF you received labwork today, you will receive an invoice from [place]. Please contact [facility] at [phone] with questions or concerns regarding your invoice.   Our billing staff will not be able to assist you with questions regarding bills from these companies.  You will be contacted with the lab results as soon as they are available. The fastest way to get your results is to activate your My Chart account. Instructions are located on the last page of this paperwork. If you have not heard from [place] regarding the results in 2 weeks, please contact this office.

## 2020-03-03 NOTE — Progress Notes (Signed)
  Subjective:  Patient ID: Luke Franco, male    DOB: [birth year],  MRN: [id number]  Chief Complaint  Patient presents with  . Plantar Warts    Laser treatment. Pt states no new concerns.    64 y.o. male presents with the above complaint.  Improving with treatment.  Review of Systems: Negative except as noted in the HPI. Denies N/V/F/Ch.  Past Medical History:  Diagnosis Date  . Allergy   . Anxiety     Current Outpatient Medications:  .  busPIRone (BUSPAR) 7.5 MG tablet, TAKE 1 TABLET BY MOUTH THREE TIMES DAILY INITIALLY TWICE PER DAY FOR FIRST WEEK, Disp: , Rfl:  .  cetirizine (ZYRTEC) 10 MG tablet, Take 10 mg by mouth daily., Disp: , Rfl:  .  cyclobenzaprine (FLEXERIL) 5 MG tablet, Take 0.5-1 tablets (2.5-5 mg total) by mouth 3 (three) times daily as needed for muscle spasms., Disp: 20 tablet, Rfl: 0 .  albuterol (VENTOLIN HFA) 108 (90 Base) MCG/ACT inhaler, Inhale 1-2 puffs into the lungs every 4 (four) hours as needed for wheezing or shortness of breath., Disp: 18 g, Rfl: 0 .  azithromycin (ZITHROMAX) 250 MG tablet, Take 2 pills by mouth on day 1, then 1 pill by mouth per day on days 2 through 5., Disp: 6 tablet, Rfl: 0 .  benzonatate (TESSALON) 100 MG capsule, Take 1 capsule (100 mg total) by mouth 3 (three) times daily as needed for cough., Disp: 20 capsule, Rfl: 0 .  clonazePAM (KLONOPIN) 0.5 MG tablet, Take 0.5 tablets (0.25 mg total) by mouth 2 (two) times daily as needed for anxiety., Disp: 20 tablet, Rfl: 0 .  sertraline (ZOLOFT) 50 MG tablet, Take 1 tablet (50 mg total) by mouth daily., Disp: 90 tablet, Rfl: 1 .  sildenafil (VIAGRA) 100 MG tablet, Take 0.5-1 tablets (50-100 mg total) by mouth daily as needed for erectile dysfunction., Disp: 5 tablet, Rfl: 11  Social History   Tobacco Use  Smoking Status Current Every Day Smoker  . Packs/day: 1.00  . Years: 25.00  . Pack years: 25.00  . Types: Cigarettes  Smokeless Tobacco Never Used    Allergies  Allergen  Reactions  . Amoxicillin     vomiting  . Prednisone Nausea And Vomiting   Objective:   There were no vitals filed for this visit. There is no height or weight on file to calculate BMI. Constitutional Well developed. Well nourished.  Vascular Dorsalis pedis pulses palpable bilaterally. Posterior tibial pulses palpable bilaterally. Capillary refill normal to all digits.  No cyanosis or clubbing noted. Pedal hair growth normal.  Neurologic Normal speech. Oriented to person, place, and time. Epicritic sensation to light touch grossly present bilaterally.  Dermatologic  large verruca plantar right foot  Orthopedic: Normal joint ROM without pain or crepitus bilaterally. No visible deformities. No bony tenderness.   Radiographs: None Assessment:   1. Verruca    Plan:  Patient was evaluated and treated and all questions answered.  Atypical Verrucous appearing lesion, Right Foot -Lesion debrided and destroyed again as below  Procedure: Destruction of Lesion Location: plantar right foot Anesthesia: none Instrumentation: 15 blade, YAG laser Technique: Debridement of lesion , followed by several passes of Yag laser to destroy lesion.     Return in about 3 weeks (around 06/22/2019) for wart laser.

## 2020-03-19 ENCOUNTER — Ambulatory Visit (INDEPENDENT_AMBULATORY_CARE_PROVIDER_SITE_OTHER): Payer: Medicare Other | Admitting: Family Medicine

## 2020-03-19 ENCOUNTER — Encounter: Payer: Self-pay | Admitting: Family Medicine

## 2020-03-19 ENCOUNTER — Other Ambulatory Visit: Payer: Self-pay

## 2020-03-19 VITALS — BP 127/68 | HR 70 | Temp 98.4°F | Ht 70.0 in | Wt 156.0 lb

## 2020-03-19 DIAGNOSIS — H65193 Other acute nonsuppurative otitis media, bilateral: Secondary | ICD-10-CM | POA: Diagnosis not present

## 2020-03-19 DIAGNOSIS — J3489 Other specified disorders of nose and nasal sinuses: Secondary | ICD-10-CM | POA: Diagnosis not present

## 2020-03-19 MED ORDER — FLUTICASONE PROPIONATE 50 MCG/ACT NA SUSP: 1.0000 | Freq: Two times a day (BID) | NASAL | 6 refills | Status: DC

## 2020-03-19 NOTE — Patient Instructions (Addendum)
Continue with zyrtec daily Add sudafed daily (pseudophenylephrine 10mg  every 4 hours)                         If you have lab work done today you will be contacted with your lab results within the next 2 weeks.  If you have not heard from [place] then please contact us. The fastest way to get your results is to register for My Chart.   IF you received an x-ray today, you will receive an invoice from [facility]. Please contact [facility] at [phone] with questions or concerns regarding your invoice.   IF you received labwork today, you will receive an invoice from [place]. Please contact [facility] at [phone] with questions or concerns regarding your invoice.   Our billing staff will not be able to assist you with questions regarding bills from these companies.  You will be contacted with the lab results as soon as they are available. The fastest way to get your results is to activate your My Chart account. Instructions are located on the last page of this paperwork. If you have not heard from [place] regarding the results in 2 weeks, please contact this office.

## 2020-03-19 NOTE — Progress Notes (Signed)
7/27/20219:24 AM  Luke Franco [birth year], 64 y.o., male [id number]  Chief Complaint  Patient presents with  . ear preassure and frontal sinus preassure    x 4 days . one episode of vomiting on friday am , balance is also off     HPI:   Patient is a 64 y.o. male with past medical history significant for GAD who presents today for ear and sinus pressure x 4 days  PCP Dr Carlota Raspberry  4 days of bilateral ear pressure,crackling and sinus pressure Having mild ringing, no hearing loss Denies spinning sensation, but feels like he is in a boat No fever or chills Mild cough, mild SOB, minimal sputum Denies any chest pain or palpitations having PND, mild nausea, vomited x 1, - getting better Denies any sick contacts Has had covid vaccines x 2, moderna smoker  Depression screen [facility] 2/9 03/19/2020 12/20/2019 11/07/2018  Decreased Interest 0 0 0  Down, Depressed, Hopeless 0 0 0  PHQ - 2 Score 0 0 0    Fall Risk  03/19/2020 12/20/2019 11/07/2018 10/25/2018 09/01/2018  Falls in the past year? 0 0 0 0 0  Comment - - - - -  Number falls in past yr: 0 - 0 - -  Injury with Fall? 0 - 0 0 -  Follow up Falls evaluation completed Falls evaluation completed Falls evaluation completed - -     Allergies  Allergen Reactions  . Amoxicillin     vomiting  . Prednisone Nausea And Vomiting    Prior to Admission medications   Medication Sig Start Date End Date Taking? Authorizing Provider  busPIRone (BUSPAR) 7.5 MG tablet TAKE 1 TABLET BY MOUTH THREE TIMES DAILY INITIALLY TWICE PER DAY FOR FIRST WEEK 11/21/18  Yes [provider]  cetirizine (ZYRTEC) 10 MG tablet Take 10 mg by mouth daily.   Yes [provider]  clonazePAM (KLONOPIN) 0.5 MG tablet Take 0.5 tablets (0.25 mg total) by mouth 2 (two) times daily as needed for anxiety. 12/20/19  Yes Wendie Agreste, MD  cyclobenzaprine (FLEXERIL) 5 MG tablet Take 0.5-1 tablets (2.5-5 mg total) by mouth 3 (three) times daily as needed for  muscle spasms. 09/01/18  Yes Wendie Agreste, MD  sertraline (ZOLOFT) 50 MG tablet Take 1 tablet (50 mg total) by mouth daily. 12/20/19  Yes Wendie Agreste, MD  sildenafil (VIAGRA) 100 MG tablet Take 0.5-1 tablets (50-100 mg total) by mouth daily as needed for erectile dysfunction. 12/20/19  Yes Wendie Agreste, MD  albuterol (VENTOLIN HFA) 108 (90 Base) MCG/ACT inhaler Inhale 1-2 puffs into the lungs every 4 (four) hours as needed for wheezing or shortness of breath. Patient not taking: Reported on 03/19/2020 09/04/19   Wendie Agreste, MD    Past Medical History:  Diagnosis Date  . Allergy   . Anxiety     Past Surgical History:  Procedure Laterality Date  . KIDNEY STONE SURGERY      Social History   Tobacco Use  . Smoking status: Current Every Day Smoker    Packs/day: 1.00    Years: 25.00    Pack years: 25.00    Types: Cigarettes  . Smokeless tobacco: Never Used  Substance Use Topics  . Alcohol use: No    Alcohol/week: 0.0 standard drinks    Family History  Problem Relation Age of Onset  . Diabetes Mother   . Heart disease Mother   . Diabetes Brother   . Diabetes Sister   .  Hyperlipidemia Sister   . Diabetes Brother   . Hyperlipidemia Brother   . Colon cancer Neg Hx   . Esophageal cancer Neg Hx   . Stomach cancer Neg Hx   . Rectal cancer Neg Hx     ROS Per hpi  OBJECTIVE:  Today's Vitals   03/19/20 0915  BP: 127/68  Pulse: 70  Temp: 98.4 F (36.9 C)  SpO2: 96%  Weight: 156 lb (70.8 kg)  Height: 5\' 10"  (1.778 m)   Body mass index is 22.38 kg/m.   Physical Exam Vitals and nursing note reviewed.  Constitutional:      Appearance: He is well-developed.  HENT:     Head: Normocephalic and atraumatic.     Right Ear: Hearing, ear canal and external ear normal. A middle ear effusion is present. Tympanic membrane is not erythematous or retracted.     Left Ear: Hearing, ear canal and external ear normal. A middle ear effusion is present. Tympanic  membrane is not erythematous or retracted.     Mouth/Throat:     Pharynx: No oropharyngeal exudate.  Eyes:     Extraocular Movements: Extraocular movements intact.     Conjunctiva/sclera: Conjunctivae normal.     Pupils: Pupils are equal, round, and reactive to light.  Cardiovascular:     Rate and Rhythm: Normal rate and regular rhythm.     Heart sounds: No murmur heard.  No friction rub. No gallop.   Pulmonary:     Effort: Pulmonary effort is normal.     Breath sounds: Normal breath sounds. No wheezing, rhonchi or rales.  Musculoskeletal:     Cervical back: Neck supple.  Lymphadenopathy:     Cervical: No cervical adenopathy.  Skin:    General: Skin is warm and dry.  Neurological:     Mental Status: He is alert and oriented to person, place, and time.     No results found for this or any previous visit (from the past 24 hour(s)).  No results found.   ASSESSMENT and PLAN  1. Acute effusion of both middle ears 2. Sinus pressure Discussed supportive measures, new meds r/se/b and RTC precautions.   Other orders - fluticasone (FLONASE) 50 MCG/ACT nasal spray; Place 1 spray into both nostrils 2 (two) times daily.  Return if symptoms worsen or fail to improve.    Rutherford Guys, MD Primary Care at [place], [place] [address] Ph.  [phone] Fax [phone]

## 2020-03-20 ENCOUNTER — Ambulatory Visit (INDEPENDENT_AMBULATORY_CARE_PROVIDER_SITE_OTHER): Payer: Medicare Other | Admitting: Family Medicine

## 2020-03-20 ENCOUNTER — Encounter: Payer: Self-pay | Admitting: Family Medicine

## 2020-03-20 VITALS — BP 155/94 | HR 84 | Temp 98.2°F | Resp 15 | Ht 70.0 in | Wt 155.8 lb

## 2020-03-20 DIAGNOSIS — R030 Elevated blood-pressure reading, without diagnosis of hypertension: Secondary | ICD-10-CM | POA: Diagnosis not present

## 2020-03-20 DIAGNOSIS — N529 Male erectile dysfunction, unspecified: Secondary | ICD-10-CM

## 2020-03-20 DIAGNOSIS — F411 Generalized anxiety disorder: Secondary | ICD-10-CM | POA: Diagnosis not present

## 2020-03-20 MED ORDER — SERTRALINE HCL 50 MG PO TABS: 50.0000 mg | ORAL_TABLET | Freq: Every day | ORAL | 1 refills | Status: DC

## 2020-03-20 NOTE — Progress Notes (Signed)
Subjective:  Patient ID: Luke Franco, male    DOB: 01/14/1956  Age: 64 y.o. MRN: [id number]  CC:  Chief Complaint  Patient presents with  . Anxiety    pt brought his clonazapam with him pt reports doing really well taking med as needed feels he has a good system for control at this time, pt anxiety score 8     HPI Luke Franco presents for   Generalized Anxiety Disorder: Stable at April 20 visit, was continued on Zoloft 50 mg daily, Klonopin as needed.  Plan for continued tapering as tolerated. Controlled substance database reviewed, last prescription for #20 prescribed on April 28.  Still on zoloft daily 50mg . Happy at current dose. No side effects at current dose.  Rare need of klonopin, only needed 4 total pills - 1/2 at a time.  Still occasional stressor with work and other stressors.  GAD 7 : Generalized Anxiety Score 03/20/2020 02/07/2019  Nervous, Anxious, on Edge 1 0  Control/stop worrying 0 0  Worry too much - different things 3 0  Trouble relaxing 1 0  Restless 0 0  Easily annoyed or irritable 1 1  Afraid - awful might happen 0 0  Total GAD 7 Score 6 1   Erectile dysfunction: Viagra 100mg  as needed.  No chest pains or hearing/vision changes.  Min facial flushing, no HA - med working well.   History Patient Active Problem List   Diagnosis Date Noted  . Chronic myofascial pain 02/02/2019  . Chronic pain disorder 02/02/2019  . Lumbar degenerative disc disease 02/02/2019  . Lumbar foraminal stenosis 02/02/2019  . Other spondylosis with radiculopathy, lumbar region 02/02/2019  . Thoracic spondylosis without myelopathy 02/02/2019  . Low back pain radiating to left lower extremity 04/07/2018  . Tobacco abuse 06/30/2013   Past Medical History:  Diagnosis Date  . Allergy   . Anxiety    Past Surgical History:  Procedure Laterality Date  . KIDNEY STONE SURGERY     Allergies  Allergen Reactions  . Amoxicillin     vomiting  . Prednisone Nausea And  Vomiting   Prior to Admission medications   Medication Sig Start Date End Date Taking? Authorizing Provider  busPIRone (BUSPAR) 7.5 MG tablet TAKE 1 TABLET BY MOUTH THREE TIMES DAILY INITIALLY TWICE PER DAY FOR FIRST WEEK 11/21/18  Yes [provider]  cetirizine (ZYRTEC) 10 MG tablet Take 10 mg by mouth daily.   Yes [provider]  clonazePAM (KLONOPIN) 0.5 MG tablet Take 0.5 tablets (0.25 mg total) by mouth 2 (two) times daily as needed for anxiety. 12/20/19  Yes Wendie Agreste, MD  cyclobenzaprine (FLEXERIL) 5 MG tablet Take 0.5-1 tablets (2.5-5 mg total) by mouth 3 (three) times daily as needed for muscle spasms. 09/01/18  Yes Wendie Agreste, MD  fluticasone (FLONASE) 50 MCG/ACT nasal spray Place 1 spray into both nostrils 2 (two) times daily. 03/19/20  Yes Rutherford Guys, MD  sertraline (ZOLOFT) 50 MG tablet Take 1 tablet (50 mg total) by mouth daily. 12/20/19  Yes Wendie Agreste, MD  sildenafil (VIAGRA) 100 MG tablet Take 0.5-1 tablets (50-100 mg total) by mouth daily as needed for erectile dysfunction. 12/20/19  Yes Wendie Agreste, MD  albuterol (VENTOLIN HFA) 108 (90 Base) MCG/ACT inhaler Inhale 1-2 puffs into the lungs every 4 (four) hours as needed for wheezing or shortness of breath. Patient not taking: Reported on 03/19/2020 09/04/19   Wendie Agreste, MD   Social History  Socioeconomic History  . Marital status: Married    Spouse name: Not on file  . Number of children: 1  . Years of education: Not on file  . Highest education level: Not on file  Occupational History  . Occupation: pipe fitter  Tobacco Use  . Smoking status: Current Every Day Smoker    Packs/day: 1.00    Years: 25.00    Pack years: 25.00    Types: Cigarettes  . Smokeless tobacco: Never Used  Vaping Use  . Vaping Use: Never used  Substance and Sexual Activity  . Alcohol use: No    Alcohol/week: 0.0 standard drinks  . Drug use: No  . Sexual activity: Not on file  Other  Topics Concern  . Not on file  Social History Narrative  . Not on file   Social Determinants of Health   Financial Resource Strain:   . Difficulty of Paying Living Expenses:   Food Insecurity:   . Worried About Charity fundraiser in the Last Year:   . Arboriculturist in the Last Year:   Transportation Needs:   . Film/video editor (Medical):   Marland Kitchen Lack of Transportation (Non-Medical):   Physical Activity:   . Days of Exercise per Week:   . Minutes of Exercise per Session:   Stress:   . Feeling of Stress :   Social Connections:   . Frequency of Communication with Friends and Family:   . Frequency of Social Gatherings with Friends and Family:   . Attends Religious Services:   . Active Member of Clubs or Organizations:   . Attends Archivist Meetings:   Marland Kitchen Marital Status:   Intimate Partner Violence:   . Fear of Current or Ex-Partner:   . Emotionally Abused:   Marland Kitchen Physically Abused:   . Sexually Abused:     Review of Systems Per HPI.   Objective:   Vitals:   03/20/20 1413 03/20/20 1522  BP: (!) 161/73 (!) 155/94  Pulse: 84   Resp: 15   Temp: 98.2 F (36.8 C)   TempSrc: Temporal   SpO2: 97%   Weight: 155 lb 12.8 oz (70.7 kg)   Height: 5\' 10"  (1.778 m)      Physical Exam Vitals reviewed.  Constitutional:      Appearance: He is well-developed.  HENT:     Head: Normocephalic and atraumatic.  Eyes:     Pupils: Pupils are equal, round, and reactive to light.  Neck:     Vascular: No carotid bruit or JVD.  Cardiovascular:     Rate and Rhythm: Normal rate and regular rhythm.     Heart sounds: Normal heart sounds. No murmur heard.   Pulmonary:     Effort: Pulmonary effort is normal.     Breath sounds: Normal breath sounds. No rales.  Skin:    General: Skin is warm and dry.  Neurological:     Mental Status: He is alert and oriented to person, place, and time.  Psychiatric:        Mood and Affect: Mood normal.        Behavior: Behavior normal.         Thought Content: Thought content normal.   ``   Assessment & Plan:  Luke Franco is a 64 y.o. male . GAD (generalized anxiety disorder) - Plan: sertraline (ZOLOFT) 50 MG tablet  -Reports improved symptoms, continue Zoloft 50 mg daily with option of 100 mg dose if increased anxiety.  Rare use of Klonopin.  Still has plenty available.  Recheck 6 months.  Commended on his exercise, activity and other things to help deal with stress.  Erectile dysfunction, unspecified erectile dysfunction type  -Doing well with Viagra at 100 mg dosing, okay to refill when needed.  Denies any side effects.  Previous precautions have been discussed.  Risk of medications have been discussed.  Elevated blood pressure reading  -Reports elevated readings at medical offices, asymptomatic, outside blood pressure readings recommended with RTC precautions.  May have whitecoat component.  Meds ordered this encounter  Medications  . sertraline (ZOLOFT) 50 MG tablet    Sig: Take 1 tablet (50 mg total) by mouth daily.    Dispense:  90 tablet    Refill:  1   Patient Instructions   I am glad to hear that you are doing better.  Okay to continue same dose of sertraline, but you also have the option of taking 2 pills/day for a total of 100 mg dose per day.  If you end up making a change and that works better, let me know so I can send in more medication.  Clonazepam if needed for breakthrough symptoms but I am glad to hear that need has also lessened.  Recheck in 6 months, let me know if there are questions sooner.  Keep a record of your blood pressures outside of the office and if over 140/90 - return to discuss further.    If you have lab work done today you will be contacted with your lab results within the next 2 weeks.  If you have not heard from [place] then please contact us. The fastest way to get your results is to register for My Chart.   IF you received an x-ray today, you will receive an invoice from  [facility]. Please contact [facility] at [phone] with questions or concerns regarding your invoice.   IF you received labwork today, you will receive an invoice from [facility]. Please contact [facility] at [phone] with questions or concerns regarding your invoice.   Our billing staff will not be able to assist you with questions regarding bills from these companies.  You will be contacted with the lab results as soon as they are available. The fastest way to get your results is to activate your My Chart account. Instructions are located on the last page of this paperwork. If you have not heard from [place] regarding the results in 2 weeks, please contact this office.         Signed, [name], MD [facility]

## 2020-03-20 NOTE — Patient Instructions (Addendum)
I am glad to hear that you are doing better.  Okay to continue same dose of sertraline, but you also have the option of taking 2 pills/day for a total of 100 mg dose per day.  If you end up making a change and that works better, let me know so I can send in more medication.  Clonazepam if needed for breakthrough symptoms but I am glad to hear that need has also lessened.  Recheck in 6 months, let me know if there are questions sooner.  Keep a record of your blood pressures outside of the office and if over 140/90 - return to discuss further.    If you have lab work done today you will be contacted with your lab results within the next 2 weeks.  If you have not heard from [place] then please contact us. The fastest way to get your results is to register for My Chart.   IF you received an x-ray today, you will receive an invoice from [facility]. Please contact [facility] at [phone] with questions or concerns regarding your invoice.   IF you received labwork today, you will receive an invoice from [facility]. Please contact [facility] at [phone] with questions or concerns regarding your invoice.   Our billing staff will not be able to assist you with questions regarding bills from these companies.  You will be contacted with the lab results as soon as they are available. The fastest way to get your results is to activate your My Chart account. Instructions are located on the last page of this paperwork. If you have not heard from [place] regarding the results in 2 weeks, please contact this office.

## 2020-05-23 ENCOUNTER — Encounter: Payer: Self-pay | Admitting: Emergency Medicine

## 2020-05-23 ENCOUNTER — Telehealth (INDEPENDENT_AMBULATORY_CARE_PROVIDER_SITE_OTHER): Payer: Medicare Other | Admitting: Emergency Medicine

## 2020-05-23 ENCOUNTER — Other Ambulatory Visit: Payer: Self-pay

## 2020-05-23 VITALS — Temp 98.0°F

## 2020-05-23 DIAGNOSIS — J029 Acute pharyngitis, unspecified: Secondary | ICD-10-CM | POA: Diagnosis not present

## 2020-05-23 MED ORDER — AZITHROMYCIN 250 MG PO TABS: ORAL_TABLET | ORAL | 0 refills | Status: AC

## 2020-05-23 NOTE — Progress Notes (Addendum)
Telemedicine Encounter- SOAP NOTE Established Patient Patient: Home  Provider: Office     This telephone encounter was conducted with the patient's (or proxy's) verbal consent via audio telecommunications: yes/no: Yes Patient was instructed to have this encounter in a suitably private space; and to only have persons present to whom they give permission to participate. In addition, patient identity was confirmed by use of name plus two identifiers (DOB and address).  I discussed the limitations, risks, security and privacy concerns of performing an evaluation and management service by telephone and the availability of in person appointments. I also discussed with the patient that there may be a patient responsible charge related to this service. The patient expressed understanding and agreed to proceed.  I spent a total of TIME; 0 MIN TO 60 MIN: 20 minutes talking with the patient or their proxy.  Chief Complaint  Patient presents with   Sore Throat    x 2 days , nausea, sweats on and off , no fever    Subjective   Luke Franco is a 64 y.o. male established patient. Telephone visit today complaining of sore throat that started 2 days ago progressively getting worse without any other associated symptoms.  Still able to eat and drink without much difficulty swallowing.  Denies fever or chills.  Smoker with dry cough.  No other complaints or medical concerns today.  HPI   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   Thoracic spondylosis without myelopathy 02/02/2019   Low back pain radiating to left lower extremity 04/07/2018   Tobacco abuse 06/30/2013    Past Medical History:  Diagnosis Date   Allergy    Anxiety     Current  Outpatient Medications  Medication Sig Dispense Refill   albuterol (VENTOLIN HFA) 108 (90 Base) MCG/ACT inhaler Inhale 1-2 puffs into the lungs every 4 (four) hours as needed for wheezing or shortness of breath. 18 g 0   cetirizine (ZYRTEC) 10 MG tablet Take 10 mg by mouth daily.     clonazePAM (KLONOPIN) 0.5 MG tablet Take 0.5 tablets (0.25 mg total) by mouth 2 (two) times daily as needed for anxiety. 20 tablet 0   cyclobenzaprine (FLEXERIL) 5 MG tablet Take 0.5-1 tablets (2.5-5 mg total) by mouth 3 (three) times daily as needed for muscle spasms. 20 tablet 0   fluticasone (FLONASE) 50 MCG/ACT nasal spray Place 1 spray into both nostrils 2 (two) times daily. 16 g 6   sertraline (ZOLOFT) 50 MG tablet Take 1 tablet (50 mg total) by mouth daily. 90 tablet 1   sildenafil (VIAGRA) 100 MG tablet Take 0.5-1 tablets (50-100 mg total) by mouth daily as needed for erectile dysfunction. 5 tablet 11   No current facility-administered medications for this visit.    Allergies  Allergen Reactions   Amoxicillin     vomiting   Prednisone Nausea And Vomiting    Social History   Socioeconomic History   Marital status: Married    Spouse name: Not on file   Number of children: 1   Years of education: Not on file   Highest education level: Not on file  Occupational History   Occupation: pipe fitter  Tobacco Use   Smoking status: Current Every Day Smoker    Packs/day: 1.00  Years: 25.00    Pack years: 25.00    Types: Cigarettes   Smokeless tobacco: Never Used  Scientific laboratory technician Use: Never used  Substance and Sexual Activity   Alcohol use: No    Alcohol/week: 0.0 standard drinks   Drug use: No   Sexual activity: Not on file  Other Topics Concern   Not on file  Social History Narrative   Not on file   Social Determinants of Health   Financial Resource Strain:    Difficulty of Paying Living Expenses: Not on file  Food Insecurity:    Worried About Heath in the Last Year: Not on file   [employer] of Food in the Last Year: Not on file  Transportation Needs:    Lack of Transportation (Medical): Not on file   Lack of Transportation (Non-Medical): Not on file  Physical Activity:    Days of Exercise per Week: Not on file   Minutes of Exercise per Session: Not on file  Stress:    Feeling of Stress : Not on file  Social Connections:    Frequency of Communication with Friends and Family: Not on file   Frequency of Social Gatherings with Friends and Family: Not on file   Attends Religious Services: Not on file   Active Member of Clubs or Organizations: Not on file   Attends Archivist Meetings: Not on file   Marital Status: Not on file  Intimate Partner Violence:    Fear of Current or Ex-Partner: Not on file   Emotionally Abused: Not on file   Physically Abused: Not on file   Sexually Abused: Not on file    Review of Systems  Constitutional: Negative.  Negative for chills and fever.  HENT: Positive for sore throat.   Respiratory: Positive for cough. Negative for sputum production and shortness of breath.   Cardiovascular: Negative.  Negative for chest pain and palpitations.  Gastrointestinal: Negative for abdominal pain, diarrhea, nausea and vomiting.  Genitourinary: Negative.  Negative for dysuria and hematuria.  Musculoskeletal: Negative for myalgias.  Skin: Negative.  Negative for rash.  Neurological: Negative.  Negative for dizziness and headaches.    Objective   Vitals as reported by the patient: Today's Vitals   05/23/20 1611  Temp: 98 F (36.7 C)    There are no diagnoses linked to this encounter. Massie was seen today for sore throat.  Diagnoses and all orders for this visit:  Sore throat -     azithromycin (ZITHROMAX) 250 MG tablet; Sig as indicated     I discussed the assessment and treatment plan with the patient. The patient was provided an opportunity to ask questions and all  were answered. The patient agreed with the plan and demonstrated an understanding of the instructions.   The patient was advised to call back or seek an in-person evaluation if the symptoms worsen or if the condition fails to improve as anticipated.  I provided 20 minutes of non-face-to-face time during this encounter.  Horald Pollen, MD  Primary Care at [facility]

## 2020-06-26 ENCOUNTER — Other Ambulatory Visit: Payer: Self-pay

## 2020-06-26 ENCOUNTER — Encounter: Payer: Self-pay | Admitting: Family Medicine

## 2020-06-26 ENCOUNTER — Ambulatory Visit (INDEPENDENT_AMBULATORY_CARE_PROVIDER_SITE_OTHER): Payer: Medicare Other | Admitting: Family Medicine

## 2020-06-26 VITALS — BP 148/82 | HR 89 | Temp 98.7°F | Ht 70.0 in | Wt 159.0 lb

## 2020-06-26 DIAGNOSIS — R2231 Localized swelling, mass and lump, right upper limb: Secondary | ICD-10-CM | POA: Diagnosis not present

## 2020-06-26 DIAGNOSIS — F411 Generalized anxiety disorder: Secondary | ICD-10-CM | POA: Diagnosis not present

## 2020-06-26 DIAGNOSIS — F17210 Nicotine dependence, cigarettes, uncomplicated: Secondary | ICD-10-CM | POA: Diagnosis not present

## 2020-06-26 DIAGNOSIS — R030 Elevated blood-pressure reading, without diagnosis of hypertension: Secondary | ICD-10-CM | POA: Diagnosis not present

## 2020-06-26 MED ORDER — SERTRALINE HCL 50 MG PO TABS: 50.0000 mg | ORAL_TABLET | Freq: Every day | ORAL | 1 refills | Status: AC

## 2020-06-26 MED ORDER — CLONAZEPAM 0.5 MG PO TABS: 0.2500 mg | ORAL_TABLET | Freq: Two times a day (BID) | ORAL | 0 refills | Status: DC | PRN

## 2020-06-26 NOTE — Patient Instructions (Addendum)
Continue zoloft daily, klonopin only as needed. If you require that med more often, follow up and we can discuss other treatments.   Blood pressure is elevated here today.  Please check your blood pressures at home and if those readings are over 140/90, let me know as we may need to start a low-dose medication.  See information below on quitting smoking.  Please let me know if I can help.  I do recommend a low-dose CAT scan for lung cancer screening.  Let me know if you would like me to order that test.  I will refer you to dermatology for the area on your hand but it is possible they may want you to meet with a surgeon.  Can start with dermatology evaluation first.   Coping with Quitting Smoking  Quitting smoking is a physical and mental challenge. You will face cravings, withdrawal symptoms, and temptation. Before quitting, work with your health care provider to make a plan that can help you cope. Preparation can help you quit and keep you from giving in. How can I cope with cravings? Cravings usually last for 5-10 minutes. If you get through it, the craving will pass. Consider taking the following actions to help you cope with cravings:  Keep your mouth busy: ? Chew sugar-free gum. ? Suck on hard candies or a straw. ? Brush your teeth.  Keep your hands and body busy: ? Immediately change to a different activity when you feel a craving. ? Squeeze or play with a ball. ? Do an activity or a hobby, like making bead jewelry, practicing needlepoint, or working with wood. ? Mix up your normal routine. ? Take a short exercise break. Go for a quick walk or run up and down stairs. ? Spend time in public places where smoking is not allowed.  Focus on doing something kind or helpful for someone else.  Call a friend or family member to talk during a craving.  Join a support group.  Call a quit line, such as 1-800-QUIT-NOW.  Talk with your health care provider about medicines that might help  you cope with cravings and make quitting easier for you. How can I deal with withdrawal symptoms? Your body may experience negative effects as it tries to get used to not having nicotine in the system. These effects are called withdrawal symptoms. They may include:  Feeling hungrier than normal.  Trouble concentrating.  Irritability.  Trouble sleeping.  Feeling depressed.  Restlessness and agitation.  Craving a cigarette. To manage withdrawal symptoms:  Avoid places, people, and activities that trigger your cravings.  Remember why you want to quit.  Get plenty of sleep.  Avoid coffee and other caffeinated drinks. These may worsen some of your symptoms. How can I handle social situations? Social situations can be difficult when you are quitting smoking, especially in the first few weeks. To manage this, you can:  Avoid parties, bars, and other social situations where people might be smoking.  Avoid alcohol.  Leave right away if you have the urge to smoke.  Explain to your family and friends that you are quitting smoking. Ask for understanding and support.  Plan activities with friends or family where smoking is not an option. What are some ways I can cope with stress? Wanting to smoke may cause stress, and stress can make you want to smoke. Find ways to manage your stress. Relaxation techniques can help. For example:  Breathe slowly and deeply, in through your nose and out  through your mouth.  Listen to soothing, relaxing music.  Talk with a family member or friend about your stress.  Light a candle.  Soak in a bath or take a shower.  Think about a peaceful place. What are some ways I can prevent weight gain? Be aware that many people gain weight after they quit smoking. However, not everyone does. To keep from gaining weight, have a plan in place before you quit and stick to the plan after you quit. Your plan should include:  Having healthy snacks. When you have  a craving, it may help to: ? Eat plain popcorn, crunchy carrots, celery, or other cut vegetables. ? Chew sugar-free gum.  Changing how you eat: ? Eat small portion sizes at meals. ? Eat 4-6 small meals throughout the day instead of 1-2 large meals a day. ? Be mindful when you eat. Do not watch television or do other things that might distract you as you eat.  Exercising regularly: ? Make time to exercise each day. If you do not have time for a long workout, do short bouts of exercise for 5-10 minutes several times a day. ? Do some form of strengthening exercise, like weight lifting, and some form of aerobic exercise, like running or swimming.  Drinking plenty of water or other low-calorie or no-calorie drinks. Drink 6-8 glasses of water daily, or as much as instructed by your health care provider. Summary  Quitting smoking is a physical and mental challenge. You will face cravings, withdrawal symptoms, and temptation to smoke again. Preparation can help you as you go through these challenges.  You can cope with cravings by keeping your mouth busy (such as by chewing gum), keeping your body and hands busy, and making calls to family, friends, or a helpline for people who want to quit smoking.  You can cope with withdrawal symptoms by avoiding places where people smoke, avoiding drinks with caffeine, and getting plenty of rest.  Ask your health care provider about the different ways to prevent weight gain, avoid stress, and handle social situations. This information is not intended to replace advice given to you by your health care provider. Make sure you discuss any questions you have with your health care provider. Document Revised: 07/23/2017 Document Reviewed: 08/07/2016 Elsevier Patient Education  2020 [employer].  Steps to Quit Smoking Smoking tobacco is the leading cause of preventable death. It can affect almost every organ in the body. Smoking puts you and those around you at  risk for developing many serious chronic diseases. Quitting smoking can be difficult, but it is one of the best things that you can do for your health. It is never too late to quit. How do I get ready to quit? When you decide to quit smoking, create a plan to help you succeed. Before you quit:  Pick a date to quit. Set a date within the next 2 weeks to give you time to prepare.  Write down the reasons why you are quitting. Keep this list in places where you will see it often.  Tell your family, friends, and co-workers that you are quitting. Support from your loved ones can make quitting easier.  Talk with your health care provider about your options for quitting smoking.  Find out what treatment options are covered by your health insurance.  Identify people, places, things, and activities that make you want to smoke (triggers). Avoid them. What first steps can I take to quit smoking?  Throw away all  cigarettes at home, at work, and in your car.  Throw away smoking accessories, such as Scientist, research (medical).  Clean your car. Make sure to empty the ashtray.  Clean your home, including curtains and carpets. What strategies can I use to quit smoking? Talk with your health care provider about combining strategies, such as taking medicines while you are also receiving in-person counseling. Using these two strategies together makes you more likely to succeed in quitting than if you used either strategy on its own.  If you are pregnant or breastfeeding, talk with your health care provider about finding counseling or other support strategies to quit smoking. Do not take medicine to help you quit smoking unless your health care provider tells you to do so. To quit smoking: Quit right away  Quit smoking completely, instead of gradually reducing how much you smoke over a period of time. Research shows that stopping smoking right away is more successful than gradually quitting.  Attend in-person  counseling to help you build problem-solving skills. You are more likely to succeed in quitting if you attend counseling sessions regularly. Even short sessions of 10 minutes can be effective. Take medicine You may take medicines to help you quit smoking. Some medicines require a prescription and some you can purchase over-the-counter. Medicines may have nicotine in them to replace the nicotine in cigarettes. Medicines may:  Help to stop cravings.  Help to relieve withdrawal symptoms. Your health care provider may recommend:  Nicotine patches, gum, or lozenges.  Nicotine inhalers or sprays.  Non-nicotine medicine that is taken by mouth. Find resources Find resources and support systems that can help you to quit smoking and remain smoke-free after you quit. These resources are most helpful when you use them often. They include:  Online chats with a Social worker.  Telephone quitlines.  Printed Furniture conservator/restorer.  Support groups or group counseling.  Text messaging programs.  Mobile phone apps or applications. Use apps that can help you stick to your quit plan by providing reminders, tips, and encouragement. There are many free apps for mobile devices as well as websites. Examples include Quit Guide from the [employer] and smokefree.gov What things can I do to make it easier to quit?   Reach out to your family and friends for support and encouragement. Call telephone quitlines (1-800-QUIT-NOW), reach out to support groups, or work with a counselor for support.  Ask people who smoke to avoid smoking around you.  Avoid places that trigger you to smoke, such as bars, parties, or smoke-break areas at work.  Spend time with people who do not smoke.  Lessen the stress in your life. Stress can be a smoking trigger for some people. To lessen stress, try: ? Exercising regularly. ? Doing deep-breathing exercises. ? Doing yoga. ? Meditating. ? Performing a body scan. This involves closing your  eyes, scanning your body from head to toe, and noticing which parts of your body are particularly tense. Try to relax the muscles in those areas. How will I feel when I quit smoking? Day 1 to 3 weeks Within the first 24 hours of quitting smoking, you may start to feel withdrawal symptoms. These symptoms are usually most noticeable 2-3 days after quitting, but they usually do not last for more than 2-3 weeks. You may experience these symptoms:  Mood swings.  Restlessness, anxiety, or irritability.  Trouble concentrating.  Dizziness.  Strong cravings for sugary foods and nicotine.  Mild weight gain.  Constipation.  Nausea.  Coughing or  a sore throat.  Changes in how the medicines that you take for unrelated issues work in your body.  Depression.  Trouble sleeping (insomnia). Week 3 and afterward After the first 2-3 weeks of quitting, you may start to notice more positive results, such as:  Improved sense of smell and taste.  Decreased coughing and sore throat.  Slower heart rate.  Lower blood pressure.  Clearer skin.  The ability to breathe more easily.  Fewer sick days. Quitting smoking can be very challenging. Do not get discouraged if you are not successful the first time. Some people need to make many attempts to quit before they achieve long-term success. Do your best to stick to your quit plan, and talk with your health care provider if you have any questions or concerns. Summary  Smoking tobacco is the leading cause of preventable death. Quitting smoking is one of the best things that you can do for your health.  When you decide to quit smoking, create a plan to help you succeed.  Quit smoking right away, not slowly over a period of time.  When you start quitting, seek help from your health care provider, family, or friends. This information is not intended to replace advice given to you by your health care provider. Make sure you discuss any questions you  have with your health care provider. Document Revised: 05/05/2019 Document Reviewed: 10/29/2018 Elsevier Patient Education  [place], Adult After being diagnosed with an anxiety disorder, you may be relieved to know why you have felt or behaved a certain way. You may also feel overwhelmed about the treatment ahead and what it will mean for your life. With care and support, you can manage this condition and recover from it. How to manage lifestyle changes Managing stress and anxiety  Stress is your body's reaction to life changes and events, both good and bad. Most stress will last just a few hours, but stress can be ongoing and can lead to more than just stress. Although stress can play a major role in anxiety, it is not the same as anxiety. Stress is usually caused by something external, such as a deadline, test, or competition. Stress normally passes after the triggering event has ended.  Anxiety is caused by something internal, such as imagining a terrible outcome or worrying that something will go wrong that will devastate you. Anxiety often does not go away even after the triggering event is over, and it can become long-term (chronic) worry. It is important to understand the differences between stress and anxiety and to manage your stress effectively so that it does not lead to an anxious response. Talk with your health care provider or a counselor to learn more about reducing anxiety and stress. He or she may suggest tension reduction techniques, such as:  Music therapy. This can include creating or listening to music that you enjoy and that inspires you.  Mindfulness-based meditation. This involves being aware of your normal breaths while not trying to control your breathing. It can be done while sitting or walking.  Centering prayer. This involves focusing on a word, phrase, or sacred image that means something to you and brings you peace.  Deep breathing. To  do this, expand your stomach and inhale slowly through your nose. Hold your breath for 3-5 seconds. Then exhale slowly, letting your stomach muscles relax.  Self-talk. This involves identifying thought patterns that lead to anxiety reactions and changing those patterns.  Muscle relaxation.  This involves tensing muscles and then relaxing them. Choose a tension reduction technique that suits your lifestyle and personality. These techniques take time and practice. Set aside 5-15 minutes a day to do them. Therapists can offer counseling and training in these techniques. The training to help with anxiety may be covered by some insurance plans. Other things you can do to manage stress and anxiety include:  Keeping a stress/anxiety diary. This can help you learn what triggers your reaction and then learn ways to manage your response.  Thinking about how you react to certain situations. You may not be able to control everything, but you can control your response.  Making time for activities that help you relax and not feeling guilty about spending your time in this way.  Visual imagery and yoga can help you stay calm and relax.  Medicines Medicines can help ease symptoms. Medicines for anxiety include:  Anti-anxiety drugs.  Antidepressants. Medicines are often used as a primary treatment for anxiety disorder. Medicines will be prescribed by a health care provider. When used together, medicines, psychotherapy, and tension reduction techniques may be the most effective treatment. Relationships Relationships can play a big part in helping you recover. Try to spend more time connecting with trusted friends and family members. Consider going to couples counseling, taking family education classes, or going to family therapy. Therapy can help you and others better understand your condition. How to recognize changes in your anxiety Everyone responds differently to treatment for anxiety. Recovery from  anxiety happens when symptoms decrease and stop interfering with your daily activities at home or work. This may mean that you will start to:  Have better concentration and focus. Worry will interfere less in your daily thinking.  Sleep better.  Be less irritable.  Have more energy.  Have improved memory. It is important to recognize when your condition is getting worse. Contact your health care provider if your symptoms interfere with home or work and you feel like your condition is not improving. Follow these instructions at home: Activity  Exercise. Most adults should do the following: ? Exercise for at least 150 minutes each week. The exercise should increase your heart rate and make you sweat (moderate-intensity exercise). ? Strengthening exercises at least twice a week.  Get the right amount and quality of sleep. Most adults need 7-9 hours of sleep each night. Lifestyle   Eat a healthy diet that includes plenty of vegetables, fruits, whole grains, low-fat dairy products, and lean protein. Do not eat a lot of foods that are high in solid fats, added sugars, or salt.  Make choices that simplify your life.  Do not use any products that contain nicotine or tobacco, such as cigarettes, e-cigarettes, and chewing tobacco. If you need help quitting, ask your health care provider.  Avoid caffeine, alcohol, and certain over-the-counter cold medicines. These may make you feel worse. Ask your pharmacist which medicines to avoid. General instructions  Take over-the-counter and prescription medicines only as told by your health care provider.  Keep all follow-up visits as told by your health care provider. This is important. Where to find support You can get help and support from these sources:  Self-help groups.  Online and [employer].  A trusted spiritual leader.  Couples counseling.  Family education classes.  Family therapy. Where to find more  information You may find that joining a support group helps you deal with your anxiety. The following sources can help you locate counselors or support groups  near you:  [place]: www.mentalhealthamerica.net  Anxiety and Depression Association of [place] (ADAA): https://www.clark.net/  National Alliance on Mental Illness (NAMI): www.nami.org Contact a health care provider if you:  Have a hard time staying focused or finishing daily tasks.  Spend many hours a day feeling worried about everyday life.  Become exhausted by worry.  Start to have headaches, feel tense, or have nausea.  Urinate more than normal.  Have diarrhea. Get help right away if you have:  A racing heart and shortness of breath.  Thoughts of hurting yourself or others. If you ever feel like you may hurt yourself or others, or have thoughts about taking your own life, get help right away. You can go to your nearest emergency department or call:  Your local emergency services (911 in the [place]  A suicide crisis helpline, such as the [place] at (678) 408-4983. This is open 24 hours a day. Summary  Taking steps to learn and use tension reduction techniques can help calm you and help prevent triggering an anxiety reaction.  When used together, medicines, psychotherapy, and tension reduction techniques may be the most effective treatment.  Family, friends, and partners can play a big part in helping you recover from an anxiety disorder. This information is not intended to replace advice given to you by your health care provider. Make sure you discuss any questions you have with your health care provider. Document Revised: 01/10/2019 Document Reviewed: 01/10/2019 Elsevier Patient Education  [employer].    If you have lab work done today you will be contacted with your lab results within the next 2 weeks.  If you have not heard from [place] then please contact us. The fastest way  to get your results is to register for My Chart.   IF you received an x-ray today, you will receive an invoice from [facility]. Please contact [facility] at [phone] with questions or concerns regarding your invoice.   IF you received labwork today, you will receive an invoice from [facility]. Please contact [facility] at [phone] with questions or concerns regarding your invoice.   Our billing staff will not be able to assist you with questions regarding bills from these companies.  You will be contacted with the lab results as soon as they are available. The fastest way to get your results is to activate your My Chart account. Instructions are located on the last page of this paperwork. If you have not heard from [place] regarding the results in 2 weeks, please contact this office.        If you have lab work done today you will be contacted with your lab results within the next 2 weeks.  If you have not heard from [place] then please contact us. The fastest way to get your results is to register for My Chart.   IF you received an x-ray today, you will receive an invoice from [facility]. Please contact [facility] at [phone] with questions or concerns regarding your invoice.   IF you received labwork today, you will receive an invoice from [place]. Please contact [facility] at [phone] with questions or concerns regarding your invoice.   Our billing staff will not be able to assist you with questions regarding bills from these companies.  You will be contacted with the lab results as soon as they are available. The fastest way to get your results is to activate your My Chart account. Instructions are located on the last page  of this paperwork. If you have not heard from [place] regarding the results in 2 weeks, please contact this office.

## 2020-06-26 NOTE — Progress Notes (Signed)
Subjective:  Patient ID: Luke Franco, male    DOB: 1956/03/24  Age: 64 y.o. MRN: [id number]  CC:  Chief Complaint  Patient presents with  . wart on hand    Pt has a wart on his R hand. Pt states it was small 3 months ago. pt states he put compound W on it and since then it has grown alot. PT reports no pain, but if he messes with it he gets nauses.  . Medication Refill    Pt would like a refill on his ClonazePAM. pt has brought his bottle in for provider to see. Pt states medication well with no side effects.    HPI Luke Franco presents for   Generalized anxiety disorder Last discussed in July, Zoloft 50 mg daily with Klonopin as needed.  Half pill of the Klonopin only if needed, rare need.  Occasional stress with work at that time. Controlled substance database (PDMP) reviewed. No concerns appreciated.  Last prescription filled April 28 for #20. 2 and 1/2 pills left - about 2 times per week.  Marijuana - rare - few weeks ago. No other IDU.  Felt like zoloft 100mg  too strong.  No current counseling - declines need today.   GAD 7 : Generalized Anxiety Score 03/20/2020 02/07/2019  Nervous, Anxious, on Edge 1 0  Control/stop worrying 0 0  Worry too much - different things 3 0  Trouble relaxing 1 0  Restless 0 0  Easily annoyed or irritable 1 1  Afraid - awful might happen 0 0  Total GAD 7 Score 6 1    Possible Wart on right hand Initially small area 3 months ago, looked like small wart. treated with Compound W. Got worse after head came off. Has continued to grow since that time. Feels nauseous if pushing on area due to pain.   Treated by podiatry in past for plantar verrucal lesion of R foot.   Elevated BP:  Thought to have whitecoat htn. Home readings unknown.   Nicotine addiction: Cutting back to 1 pack per day - prior 2 packs per day. Smoker since 64yo - 70 pack year history.  Low dose lung CA screening CT discussed. Declines at this time.   History Patient  Active Problem List   Diagnosis Date Noted  . Chronic myofascial pain 02/02/2019  . Chronic pain disorder 02/02/2019  . Lumbar degenerative disc disease 02/02/2019  . Lumbar foraminal stenosis 02/02/2019  . Other spondylosis with radiculopathy, lumbar region 02/02/2019  . Thoracic spondylosis without myelopathy 02/02/2019  . Low back pain radiating to left lower extremity 04/07/2018  . Tobacco abuse 06/30/2013   Past Medical History:  Diagnosis Date  . Allergy   . Anxiety    Past Surgical History:  Procedure Laterality Date  . KIDNEY STONE SURGERY     Allergies  Allergen Reactions  . Amoxicillin     vomiting  . Prednisone Nausea And Vomiting   Prior to Admission medications   Medication Sig Start Date End Date Taking? Authorizing Provider  albuterol (VENTOLIN HFA) 108 (90 Base) MCG/ACT inhaler Inhale 1-2 puffs into the lungs every 4 (four) hours as needed for wheezing or shortness of breath. 09/04/19  Yes Wendie Agreste, MD  azithromycin [facility]) 250 MG tablet Sig as indicated 05/23/20  Yes Sagardia, Ines Bloomer, MD  cetirizine (ZYRTEC) 10 MG tablet Take 10 mg by mouth daily.   Yes [provider]  clonazePAM (KLONOPIN) 0.5 MG tablet Take 0.5 tablets (0.25 mg  total) by mouth 2 (two) times daily as needed for anxiety. 12/20/19  Yes Wendie Agreste, MD  cyclobenzaprine (FLEXERIL) 5 MG tablet Take 0.5-1 tablets (2.5-5 mg total) by mouth 3 (three) times daily as needed for muscle spasms. 09/01/18  Yes Wendie Agreste, MD  fluticasone (FLONASE) 50 MCG/ACT nasal spray Place 1 spray into both nostrils 2 (two) times daily. 03/19/20  Yes Rutherford Guys, MD  sertraline (ZOLOFT) 50 MG tablet Take 1 tablet (50 mg total) by mouth daily. 03/20/20  Yes Wendie Agreste, MD  sildenafil (VIAGRA) 100 MG tablet Take 0.5-1 tablets (50-100 mg total) by mouth daily as needed for erectile dysfunction. 12/20/19  Yes Wendie Agreste, MD   Social History   Socioeconomic History  .  Marital status: Married    Spouse name: Not on file  . Number of children: 1  . Years of education: Not on file  . Highest education level: Not on file  Occupational History  . Occupation: pipe fitter  Tobacco Use  . Smoking status: Current Every Day Smoker    Packs/day: 1.00    Years: 25.00    Pack years: 25.00    Types: Cigarettes  . Smokeless tobacco: Never Used  Vaping Use  . Vaping Use: Never used  Substance and Sexual Activity  . Alcohol use: No    Alcohol/week: 0.0 standard drinks  . Drug use: No  . Sexual activity: Not on file  Other Topics Concern  . Not on file  Social History Narrative  . Not on file   Social Determinants of Health   Financial Resource Strain:   . Difficulty of Paying Living Expenses: Not on file  Food Insecurity:   . Worried About Charity fundraiser in the Last Year: Not on file  . Ran Out of Food in the Last Year: Not on file  Transportation Needs:   . Lack of Transportation (Medical): Not on file  . Lack of Transportation (Non-Medical): Not on file  Physical Activity:   . Days of Exercise per Week: Not on file  . Minutes of Exercise per Session: Not on file  Stress:   . Feeling of Stress : Not on file  Social Connections:   . Frequency of Communication with Friends and Family: Not on file  . Frequency of Social Gatherings with Friends and Family: Not on file  . Attends Religious Services: Not on file  . Active Member of Clubs or Organizations: Not on file  . Attends Archivist Meetings: Not on file  . Marital Status: Not on file  Intimate Partner Violence:   . Fear of Current or Ex-Partner: Not on file  . Emotionally Abused: Not on file  . Physically Abused: Not on file  . Sexually Abused: Not on file    Review of Systems Per HPI.   Objective:   Vitals:   06/26/20 1029  BP: (!) 148/82  Pulse: 89  Temp: 98.7 F (37.1 C)  TempSrc: Temporal  SpO2: 94%  Weight: 159 lb (72.1 kg)  Height: 5\' 10"  (1.778 m)      Physical Exam Vitals reviewed.  Constitutional:      Appearance: He is well-developed.  HENT:     Head: Normocephalic and atraumatic.  Eyes:     Pupils: Pupils are equal, round, and reactive to light.  Neck:     Vascular: No carotid bruit or JVD.  Cardiovascular:     Rate and Rhythm: Normal rate and regular rhythm.  Heart sounds: Normal heart sounds. No murmur heard.   Pulmonary:     Effort: Pulmonary effort is normal.     Breath sounds: Normal breath sounds. No rales.  Skin:    General: Skin is warm and dry.     Comments: R hand 3cm round mass with excoriated center. 23mm elevated. See photo.   Neurological:     Mental Status: He is alert and oriented to person, place, and time.        Assessment & Plan:  Luke Franco is a 64 y.o. male . Mass of right hand - Plan: Ambulatory referral to Dermatology  -Initially described as vertical lesion, now with large lesion, possible granuloma.  Refer to dermatology initially, but based on size may need surgical evaluation.  GAD (generalized anxiety disorder) - Plan: clonazePAM (KLONOPIN) 0.5 MG tablet, sertraline (ZOLOFT) 50 MG tablet  -Overall stable, continue Zoloft, Klonopin same doses.  Klonopin refilled.  No concerns on controlled substance database.  Intermittent use only.  Elevated blood pressure reading  -Suspect whitecoat component, but will have him check readings at home with RTC precautions  Cigarette nicotine dependence without complication  -Commended on decreased to 1 pack/day.  Handout given on coping with quitting smoking steps to quit smoking.  Low-dose CT scan recommended for lung cancer screening, declined at this time.  Meds ordered this encounter  Medications  . clonazePAM (KLONOPIN) 0.5 MG tablet    Sig: Take 0.5 tablets (0.25 mg total) by mouth 2 (two) times daily as needed for anxiety.    Dispense:  20 tablet    Refill:  0  . sertraline (ZOLOFT) 50 MG tablet    Sig: Take 1 tablet (50 mg  total) by mouth daily.    Dispense:  90 tablet    Refill:  1   Patient Instructions   Continue zoloft daily, klonopin only as needed. If you require that med more often, follow up and we can discuss other treatments.   Blood pressure is elevated here today.  Please check your blood pressures at home and if those readings are over 140/90, let me know as we may need to start a low-dose medication.  See information below on quitting smoking.  Please let me know if I can help.  I do recommend a low-dose CAT scan for lung cancer screening.  Let me know if you would like me to order that test.  I will refer you to dermatology for the area on your hand but it is possible they may want you to meet with a surgeon.  Can start with dermatology evaluation first.  Managing Anxiety, Adult After being diagnosed with an anxiety disorder, you may be relieved to know why you have felt or behaved a certain way. You may also feel overwhelmed about the treatment ahead and what it will mean for your life. With care and support, you can manage this condition and recover from it. How to manage lifestyle changes Managing stress and anxiety  Stress is your body's reaction to life changes and events, both good and bad. Most stress will last just a few hours, but stress can be ongoing and can lead to more than just stress. Although stress can play a major role in anxiety, it is not the same as anxiety. Stress is usually caused by something external, such as a deadline, test, or competition. Stress normally passes after the triggering event has ended.  Anxiety is caused by something internal, such as imagining a terrible  outcome or worrying that something will go wrong that will devastate you. Anxiety often does not go away even after the triggering event is over, and it can become long-term (chronic) worry. It is important to understand the differences between stress and anxiety and to manage your stress effectively so that  it does not lead to an anxious response. Talk with your health care provider or a counselor to learn more about reducing anxiety and stress. He or she may suggest tension reduction techniques, such as:  Music therapy. This can include creating or listening to music that you enjoy and that inspires you.  Mindfulness-based meditation. This involves being aware of your normal breaths while not trying to control your breathing. It can be done while sitting or walking.  Centering prayer. This involves focusing on a word, phrase, or sacred image that means something to you and brings you peace.  Deep breathing. To do this, expand your stomach and inhale slowly through your nose. Hold your breath for 3-5 seconds. Then exhale slowly, letting your stomach muscles relax.  Self-talk. This involves identifying thought patterns that lead to anxiety reactions and changing those patterns.  Muscle relaxation. This involves tensing muscles and then relaxing them. Choose a tension reduction technique that suits your lifestyle and personality. These techniques take time and practice. Set aside 5-15 minutes a day to do them. Therapists can offer counseling and training in these techniques. The training to help with anxiety may be covered by some insurance plans. Other things you can do to manage stress and anxiety include:  Keeping a stress/anxiety diary. This can help you learn what triggers your reaction and then learn ways to manage your response.  Thinking about how you react to certain situations. You may not be able to control everything, but you can control your response.  Making time for activities that help you relax and not feeling guilty about spending your time in this way.  Visual imagery and yoga can help you stay calm and relax.  Medicines Medicines can help ease symptoms. Medicines for anxiety include:  Anti-anxiety drugs.  Antidepressants. Medicines are often used as a primary treatment  for anxiety disorder. Medicines will be prescribed by a health care provider. When used together, medicines, psychotherapy, and tension reduction techniques may be the most effective treatment. Relationships Relationships can play a big part in helping you recover. Try to spend more time connecting with trusted friends and family members. Consider going to couples counseling, taking family education classes, or going to family therapy. Therapy can help you and others better understand your condition. How to recognize changes in your anxiety Everyone responds differently to treatment for anxiety. Recovery from anxiety happens when symptoms decrease and stop interfering with your daily activities at home or work. This may mean that you will start to:  Have better concentration and focus. Worry will interfere less in your daily thinking.  Sleep better.  Be less irritable.  Have more energy.  Have improved memory. It is important to recognize when your condition is getting worse. Contact your health care provider if your symptoms interfere with home or work and you feel like your condition is not improving. Follow these instructions at home: Activity  Exercise. Most adults should do the following: ? Exercise for at least 150 minutes each week. The exercise should increase your heart rate and make you sweat (moderate-intensity exercise). ? Strengthening exercises at least twice a week.  Get the right amount and quality of sleep. Most  adults need 7-9 hours of sleep each night. Lifestyle   Eat a healthy diet that includes plenty of vegetables, fruits, whole grains, low-fat dairy products, and lean protein. Do not eat a lot of foods that are high in solid fats, added sugars, or salt.  Make choices that simplify your life.  Do not use any products that contain nicotine or tobacco, such as cigarettes, e-cigarettes, and chewing tobacco. If you need help quitting, ask your health care  provider.  Avoid caffeine, alcohol, and certain over-the-counter cold medicines. These may make you feel worse. Ask your pharmacist which medicines to avoid. General instructions  Take over-the-counter and prescription medicines only as told by your health care provider.  Keep all follow-up visits as told by your health care provider. This is important. Where to find support You can get help and support from these sources:  Self-help groups.  Online and [employer].  A trusted spiritual leader.  Couples counseling.  Family education classes.  Family therapy. Where to find more information You may find that joining a support group helps you deal with your anxiety. The following sources can help you locate counselors or support groups near you:  [place]: www.mentalhealth[place].net  Anxiety and Depression Association of [place] (ADAA): https://www.clark.net/  National Alliance on Mental Illness (NAMI): www.nami.org Contact a health care provider if you:  Have a hard time staying focused or finishing daily tasks.  Spend many hours a day feeling worried about everyday life.  Become exhausted by worry.  Start to have headaches, feel tense, or have nausea.  Urinate more than normal.  Have diarrhea. Get help right away if you have:  A racing heart and shortness of breath.  Thoughts of hurting yourself or others. If you ever feel like you may hurt yourself or others, or have thoughts about taking your own life, get help right away. You can go to your nearest emergency department or call:  Your local emergency services (911 in the [place]  A suicide crisis helpline, such as the [place] at (973) 404-8234. This is open 24 hours a day. Summary  Taking steps to learn and use tension reduction techniques can help calm you and help prevent triggering an anxiety reaction.  When used together, medicines, psychotherapy, and tension  reduction techniques may be the most effective treatment.  Family, friends, and partners can play a big part in helping you recover from an anxiety disorder. This information is not intended to replace advice given to you by your health care provider. Make sure you discuss any questions you have with your health care provider. Document Revised: 01/10/2019 Document Reviewed: 01/10/2019 Elsevier Patient Education  [employer].    If you have lab work done today you will be contacted with your lab results within the next 2 weeks.  If you have not heard from [place] then please contact us. The fastest way to get your results is to register for My Chart.   IF you received an x-ray today, you will receive an invoice from [facility]. Please contact [facility] at [phone] with questions or concerns regarding your invoice.   IF you received labwork today, you will receive an invoice from [place]. Please contact [facility] at [phone] with questions or concerns regarding your invoice.   Our billing staff will not be able to assist you with questions regarding bills from these companies.  You will be contacted with the lab results as soon as they are available. The fastest  way to get your results is to activate your My Chart account. Instructions are located on the last page of this paperwork. If you have not heard from [place] regarding the results in 2 weeks, please contact this office.        If you have lab work done today you will be contacted with your lab results within the next 2 weeks.  If you have not heard from [place] then please contact us. The fastest way to get your results is to register for My Chart.   IF you received an x-ray today, you will receive an invoice from [facility]. Please contact [facility] at [phone] with questions or concerns regarding your invoice.   IF you received labwork today, you will receive an invoice from [place].  Please contact [facility] at [phone] with questions or concerns regarding your invoice.   Our billing staff will not be able to assist you with questions regarding bills from these companies.  You will be contacted with the lab results as soon as they are available. The fastest way to get your results is to activate your My Chart account. Instructions are located on the last page of this paperwork. If you have not heard from [place] regarding the results in 2 weeks, please contact this office.         Signed, [name], MD [facility]

## 2020-07-01 DIAGNOSIS — D485 Neoplasm of uncertain behavior of skin: Secondary | ICD-10-CM | POA: Diagnosis not present

## 2020-07-10 DIAGNOSIS — Z85828 Personal history of other malignant neoplasm of skin: Secondary | ICD-10-CM | POA: Diagnosis not present

## 2020-07-10 DIAGNOSIS — C44622 Squamous cell carcinoma of skin of right upper limb, including shoulder: Secondary | ICD-10-CM | POA: Diagnosis not present

## 2020-09-25 ENCOUNTER — Ambulatory Visit: Payer: Medicare Other | Admitting: Family Medicine

## 2020-09-26 ENCOUNTER — Other Ambulatory Visit: Payer: Self-pay

## 2020-09-26 ENCOUNTER — Ambulatory Visit: Payer: Medicare Other | Admitting: Family Medicine

## 2020-09-26 ENCOUNTER — Ambulatory Visit (INDEPENDENT_AMBULATORY_CARE_PROVIDER_SITE_OTHER): Payer: Medicare Other | Admitting: Family Medicine

## 2020-09-26 ENCOUNTER — Encounter: Payer: Self-pay | Admitting: Family Medicine

## 2020-09-26 VITALS — BP 160/90 | HR 100 | Temp 98.0°F | Ht 70.0 in | Wt 166.0 lb

## 2020-09-26 DIAGNOSIS — I10 Essential (primary) hypertension: Secondary | ICD-10-CM | POA: Diagnosis not present

## 2020-09-26 DIAGNOSIS — R2231 Localized swelling, mass and lump, right upper limb: Secondary | ICD-10-CM | POA: Diagnosis not present

## 2020-09-26 DIAGNOSIS — F17210 Nicotine dependence, cigarettes, uncomplicated: Secondary | ICD-10-CM

## 2020-09-26 DIAGNOSIS — F411 Generalized anxiety disorder: Secondary | ICD-10-CM

## 2020-09-26 MED ORDER — CLONAZEPAM 0.5 MG PO TABS
0.2500 mg | ORAL_TABLET | Freq: Two times a day (BID) | ORAL | 0 refills | Status: AC | PRN
Start: 2020-09-26 — End: ?

## 2020-09-26 MED ORDER — AMLODIPINE BESYLATE 2.5 MG PO TABS: 2.5000 mg | ORAL_TABLET | Freq: Every day | ORAL | 3 refills | Status: DC

## 2020-09-26 MED ORDER — FLUTICASONE PROPIONATE 50 MCG/ACT NA SUSP: 1.0000 | Freq: Two times a day (BID) | NASAL | 6 refills | Status: AC

## 2020-09-26 NOTE — Progress Notes (Signed)
Subjective:  Patient ID: Luke Franco, male    DOB: 1955-10-09  Age: 65 y.o. MRN: [id number]  CC:  Chief Complaint  Patient presents with  . Follow-up    On Blood pressure and medication review. PT reports no issues with BP pt reports his BP has been running a little lower than today's reading. Pt needs a refill on his Klonopin and Flonase. PT reports these work well for him with no side effects.    HPI Luke Franco presents for   Generalized anxiety disorder: Last discussed in November.  Zoloft 50 mg daily with Klonopin as needed.  Using about 2 times per week.  Rare marijuana use at that time.  Felt like 100 mg Zoloft was too strong previously.  Continued on same dose of 50 mg.  Increased anxiety recently, argument with spouse last night. Usually doing better than today.  Still refuses counseling at this point.  Klonopin 2 times per week on average. Last filled # 20 on 06/26/20. Refill today. Controlled substance database (PDMP) reviewed. No concerns appreciated.  Marijuana few times per week - helps to relax at night. No relief with hydroxyzine prior.   GAD 7 : Generalized Anxiety Score 09/26/2020 03/20/2020 02/07/2019  Nervous, Anxious, on Edge 2 1 0  Control/stop worrying 2 0 0  Worry too much - different things 2 3 0  Trouble relaxing 2 1 0  Restless 1 0 0  Easily annoyed or irritable 1 1 1   Afraid - awful might happen 1 0 0  Total GAD 7 Score 11 6 1    Nicotine addiction Cessation recommended last visit.  Handout given.  Also recommended low-dose CT for lung cancer screening, declined at that time. Agrees now.  Down to 1/2 ppd from 1ppd.   Squamous cell carcinoma, hand. Referred to dermatology last visit, had excision and skin grafting from upper arm.  Healing well.   Elevated blood pressure Noted last visit, no current antihypertensives. Stressful night last night.  Home readings: 140/80 range.  No beer/alcohol. Added salt to most foods.  No regular fast food some  frozen foods. Take out once per week.    BP Readings from Last 3 Encounters:  09/26/20 (!) 160/90  06/26/20 (!) 148/82  03/20/20 (!) 155/94    History Patient Active Problem List   Diagnosis Date Noted  . Chronic myofascial pain 02/02/2019  . Chronic pain disorder 02/02/2019  . Lumbar degenerative disc disease 02/02/2019  . Lumbar foraminal stenosis 02/02/2019  . Other spondylosis with radiculopathy, lumbar region 02/02/2019  . Thoracic spondylosis without myelopathy 02/02/2019  . Low back pain radiating to left lower extremity 04/07/2018  . Tobacco abuse 06/30/2013   Past Medical History:  Diagnosis Date  . Allergy   . Anxiety    Past Surgical History:  Procedure Laterality Date  . KIDNEY STONE SURGERY     Allergies  Allergen Reactions  . Amoxicillin     vomiting  . Prednisone Nausea And Vomiting   Prior to Admission medications   Medication Sig Start Date End Date Taking? Authorizing Provider  albuterol (VENTOLIN HFA) 108 (90 Base) MCG/ACT inhaler Inhale 1-2 puffs into the lungs every 4 (four) hours as needed for wheezing or shortness of breath. 09/04/19  Yes Wendie Agreste, MD  azithromycin [facility]) 250 MG tablet Sig as indicated 05/23/20  Yes Sagardia, Ines Bloomer, MD  cetirizine (ZYRTEC) 10 MG tablet Take 10 mg by mouth daily.   Yes [provider]  clonazePAM [facility])  0.5 MG tablet Take 0.5 tablets (0.25 mg total) by mouth 2 (two) times daily as needed for anxiety. 06/26/20  Yes Wendie Agreste, MD  cyclobenzaprine (FLEXERIL) 5 MG tablet Take 0.5-1 tablets (2.5-5 mg total) by mouth 3 (three) times daily as needed for muscle spasms. 09/01/18  Yes Wendie Agreste, MD  fluticasone (FLONASE) 50 MCG/ACT nasal spray Place 1 spray into both nostrils 2 (two) times daily. 03/19/20  Yes Jacelyn Pi, Lilia Argue, MD  sertraline (ZOLOFT) 50 MG tablet Take 1 tablet (50 mg total) by mouth daily. 06/26/20  Yes Wendie Agreste, MD  sildenafil (VIAGRA) 100 MG tablet  Take 0.5-1 tablets (50-100 mg total) by mouth daily as needed for erectile dysfunction. 12/20/19  Yes Wendie Agreste, MD   Social History   Socioeconomic History  . Marital status: Married    Spouse name: Not on file  . Number of children: 1  . Years of education: Not on file  . Highest education level: Not on file  Occupational History  . Occupation: pipe fitter  Tobacco Use  . Smoking status: Current Every Day Smoker    Packs/day: 1.00    Years: 25.00    Pack years: 25.00    Types: Cigarettes  . Smokeless tobacco: Never Used  Vaping Use  . Vaping Use: Never used  Substance and Sexual Activity  . Alcohol use: No    Alcohol/week: 0.0 standard drinks  . Drug use: No  . Sexual activity: Not on file  Other Topics Concern  . Not on file  Social History Narrative  . Not on file   Social Determinants of Health   Financial Resource Strain: Not on file  Food Insecurity: Not on file  Transportation Needs: Not on file  Physical Activity: Not on file  Stress: Not on file  Social Connections: Not on file  Intimate Partner Violence: Not on file    Review of Systems  Constitutional: Negative for fatigue and unexpected weight change.  Eyes: Negative for visual disturbance.  Respiratory: Negative for cough, chest tightness and shortness of breath.   Cardiovascular: Negative for chest pain, palpitations and leg swelling.  Gastrointestinal: Negative for abdominal pain and blood in stool.  Neurological: Negative for dizziness, light-headedness and headaches.     Objective:   Vitals:   09/26/20 1431 09/26/20 1432  BP: (!) 158/87 (!) 160/90  Pulse: 100   Temp: 98 F (36.7 C)   TempSrc: Temporal   SpO2: 96%   Weight: 166 lb (75.3 kg)   Height: 5\' 10"  (1.778 m)      Physical Exam Vitals reviewed.  Constitutional:      Appearance: He is well-developed and well-nourished.  HENT:     Head: Normocephalic and atraumatic.  Eyes:     Extraocular Movements: EOM normal.      Pupils: Pupils are equal, round, and reactive to light.  Neck:     Vascular: No carotid bruit or JVD.  Cardiovascular:     Rate and Rhythm: Normal rate and regular rhythm.     Heart sounds: Normal heart sounds. No murmur heard.   Pulmonary:     Effort: Pulmonary effort is normal.     Breath sounds: Normal breath sounds. No rales.  Musculoskeletal:        General: No edema.  Skin:    General: Skin is warm and dry.     Comments: Healed wound R hand, R upper arm.   Neurological:     Mental Status:  He is alert and oriented to person, place, and time.  Psychiatric:        Attention and Perception: Attention normal.        Mood and Affect: Affect normal. Mood is anxious.        Speech: Speech normal.        Behavior: Behavior normal.        Thought Content: Thought content normal.      36 minutes spent during visit, greater than 50% counseling and assimilation of information, chart review, and discussion of plan.    Assessment & Plan:  Luke Franco is a 66 y.o. male . Mass of right hand  -Status post excision of carcinoma, healing well with skin graft.  GAD (generalized anxiety disorder) - Plan: clonazePAM (KLONOPIN) 0.5 MG tablet  -Recent worse symptoms with relationship stressor/argument.  Overall reports improved symptoms, declines counseling, declines higher dose of sertraline.  Refilled Klonopin for as needed use but recommended counseling or med changes if persistent or more frequent need.  Essential hypertension - Plan: amLODipine (NORVASC) 2.5 MG tablet  -Avoidance of sodium discussed, handout given, start amlodipine 2.5 mg daily, recheck 6 weeks.  Cigarette nicotine dependence without complication - Plan: Ambulatory referral to Pulmonology  -Commended on cutting back on nicotine.  Will refer to discuss low-dose CT lung cancer screening.  Meds ordered this encounter  Medications  . clonazePAM (KLONOPIN) 0.5 MG tablet    Sig: Take 0.5 tablets (0.25 mg total) by  mouth 2 (two) times daily as needed for anxiety.    Dispense:  20 tablet    Refill:  0  . fluticasone (FLONASE) 50 MCG/ACT nasal spray    Sig: Place 1 spray into both nostrils 2 (two) times daily.    Dispense:  16 g    Refill:  6  . amLODipine (NORVASC) 2.5 MG tablet    Sig: Take 1 tablet (2.5 mg total) by mouth daily.    Dispense:  30 tablet    Refill:  3   Patient Instructions   Keep up the good work on cutting back on cigarettes. I will refer you for low dose CT scan for cancer screening - will meet with provider first to discuss this test.   I would consider meeting with therapist if persistent anxiety difficulty as we discussed, or possibly slight higher dose of Zoloft.  Can discuss further next visit.  cut back on added salt, see handout. start new blood pressure med once per day.  Recheck in 6 weeks.   Return to the clinic or go to the nearest emergency room if any of your symptoms worsen or new symptoms occur.    Managing Your Hypertension Hypertension, also called high blood pressure, is when the force of the blood pressing against the walls of the arteries is too strong. Arteries are blood vessels that carry blood from your heart throughout your body. Hypertension forces the heart to work harder to pump blood and may cause the arteries to become narrow or stiff. Understanding blood pressure readings Your personal target blood pressure may vary depending on your medical conditions, your age, and other factors. A blood pressure reading includes a higher number over a lower number. Ideally, your blood pressure should be below 120/80. You should know that:  The first, or top, number is called the systolic pressure. It is a measure of the pressure in your arteries as your heart beats.  The second, or bottom number, is called the diastolic pressure. It  is a measure of the pressure in your arteries as the heart relaxes. Blood pressure is classified into four stages. Based on your  blood pressure reading, your health care provider may use the following stages to determine what type of treatment you need, if any. Systolic pressure and diastolic pressure are measured in a unit called mmHg. Normal  Systolic pressure: below 123456.  Diastolic pressure: below 80. Elevated  Systolic pressure: [id number].  Diastolic pressure: below 80. Hypertension stage 1  Systolic pressure: 0000000.  Diastolic pressure: [id number]. Hypertension stage 2  Systolic pressure: [id number] or above.  Diastolic pressure: 90 or above. How can this condition affect me? Managing your hypertension is an important responsibility. Over time, hypertension can damage the arteries and decrease blood flow to important parts of the body, including the brain, heart, and kidneys. Having untreated or uncontrolled hypertension can lead to:  A heart attack.  A stroke.  A weakened blood vessel (aneurysm).  Heart failure.  Kidney damage.  Eye damage.  Metabolic syndrome.  Memory and concentration problems.  Vascular dementia. What actions can I take to manage this condition? Hypertension can be managed by making lifestyle changes and possibly by taking medicines. Your health care provider will help you make a plan to bring your blood pressure within a normal range. Nutrition  Eat a diet that is high in fiber and potassium, and low in salt (sodium), added sugar, and fat. An example eating plan is called the Dietary Approaches to Stop Hypertension (DASH) diet. To eat this way: ? Eat plenty of fresh fruits and vegetables. Try to fill one-half of your plate at each meal with fruits and vegetables. ? Eat whole grains, such as whole-wheat pasta, brown rice, or whole-grain bread. Fill about one-fourth of your plate with whole grains. ? Eat low-fat dairy products. ? Avoid fatty cuts of meat, processed or cured meats, and poultry with skin. Fill about one-fourth of your plate with lean proteins such as fish, chicken  without skin, beans, eggs, and tofu. ? Avoid pre-made and processed foods. These tend to be higher in sodium, added sugar, and fat.  Reduce your daily sodium intake. Most people with hypertension should eat less than 1,500 mg of sodium a day.   Lifestyle  Work with your health care provider to maintain a healthy body weight or to lose weight. Ask what an ideal weight is for you.  Get at least 30 minutes of exercise that causes your heart to beat faster (aerobic exercise) most days of the week. Activities may include walking, swimming, or biking.  Include exercise to strengthen your muscles (resistance exercise), such as weight lifting, as part of your weekly exercise routine. Try to do these types of exercises for 30 minutes at least 3 days a week.  Do not use any products that contain nicotine or tobacco, such as cigarettes, e-cigarettes, and chewing tobacco. If you need help quitting, ask your health care provider.  Control any long-term (chronic) conditions you have, such as high cholesterol or diabetes.  Identify your sources of stress and find ways to manage stress. This may include meditation, deep breathing, or making time for fun activities.   Alcohol use  Do not drink alcohol if: ? Your health care provider tells you not to drink. ? You are pregnant, may be pregnant, or are planning to become pregnant.  If you drink alcohol: ? Limit how much you use to:  0-1 drink a day for women.  0-2 drinks a day for men. ?  Be aware of how much alcohol is in your drink. In the [place] one drink equals one 12 oz bottle of beer (355 mL), one 5 oz glass of wine (148 mL), or one 1 oz glass of hard liquor (44 mL). Medicines Your health care provider may prescribe medicine if lifestyle changes are not enough to get your blood pressure under control and if:  Your systolic blood pressure is 130 or higher.  Your diastolic blood pressure is 80 or higher. Take medicines only as told by your health  care provider. Follow the directions carefully. Blood pressure medicines must be taken as told by your health care provider. The medicine does not work as well when you skip doses. Skipping doses also puts you at risk for problems. Monitoring Before you monitor your blood pressure:  Do not smoke, drink caffeinated beverages, or exercise within 30 minutes before taking a measurement.  Use the bathroom and empty your bladder (urinate).  Sit quietly for at least 5 minutes before taking measurements. Monitor your blood pressure at home as told by your health care provider. To do this:  Sit with your back straight and supported.  Place your feet flat on the floor. Do not cross your legs.  Support your arm on a flat surface, such as a table. Make sure your upper arm is at heart level.  Each time you measure, take two or three readings one minute apart and record the results. You may also need to have your blood pressure checked regularly by your health care provider.   General information  Talk with your health care provider about your diet, exercise habits, and other lifestyle factors that may be contributing to hypertension.  Review all the medicines you take with your health care provider because there may be side effects or interactions.  Keep all visits as told by your health care provider. Your health care provider can help you create and adjust your plan for managing your high blood pressure. Where to find more information  National Heart, Lung, and Blood Institute: https://wilson-eaton.com/  American Heart Association: www.heart.org Contact a health care provider if:  You think you are having a reaction to medicines you have taken.  You have repeated (recurrent) headaches.  You feel dizzy.  You have swelling in your ankles.  You have trouble with your vision. Get help right away if:  You develop a severe headache or confusion.  You have unusual weakness or numbness, or you  feel faint.  You have severe pain in your chest or abdomen.  You vomit repeatedly.  You have trouble breathing. These symptoms may represent a serious problem that is an emergency. Do not wait to see if the symptoms will go away. Get medical help right away. Call your local emergency services (911 in the [place] Do not drive yourself to the hospital. Summary  Hypertension is when the force of blood pumping through your arteries is too strong. If this condition is not controlled, it may put you at risk for serious complications.  Your personal target blood pressure may vary depending on your medical conditions, your age, and other factors. For most people, a normal blood pressure is less than 120/80.  Hypertension is managed by lifestyle changes, medicines, or both.  Lifestyle changes to help manage hypertension include losing weight, eating a healthy, low-sodium diet, exercising more, stopping smoking, and limiting alcohol. This information is not intended to replace advice given to you by your health care provider. Make sure you discuss  any questions you have with your health care provider. Document Revised: 09/15/2019 Document Reviewed: 07/11/2019 Elsevier Patient Education  2021 [employer].    If you have lab work done today you will be contacted with your lab results within the next 2 weeks.  If you have not heard from [place] then please contact us. The fastest way to get your results is to register for My Chart.   IF you received an x-ray today, you will receive an invoice from [facility]. Please contact [facility] at [phone] with questions or concerns regarding your invoice.   IF you received labwork today, you will receive an invoice from [place]. Please contact [facility] at [phone] with questions or concerns regarding your invoice.   Our billing staff will not be able to assist you with questions regarding bills from these companies.  You will be  contacted with the lab results as soon as they are available. The fastest way to get your results is to activate your My Chart account. Instructions are located on the last page of this paperwork. If you have not heard from [place] regarding the results in 2 weeks, please contact this office.         Signed, [name], MD [facility]

## 2020-09-26 NOTE — Patient Instructions (Addendum)
Keep up the good work on cutting back on cigarettes. I will refer you for low dose CT scan for cancer screening - will meet with provider first to discuss this test.   I would consider meeting with therapist if persistent anxiety difficulty as we discussed, or possibly slight higher dose of Zoloft.  Can discuss further next visit.  cut back on added salt, see handout. start new blood pressure med once per day.  Recheck in 6 weeks.   Return to the clinic or go to the nearest emergency room if any of your symptoms worsen or new symptoms occur.    Managing Your Hypertension Hypertension, also called high blood pressure, is when the force of the blood pressing against the walls of the arteries is too strong. Arteries are blood vessels that carry blood from your heart throughout your body. Hypertension forces the heart to work harder to pump blood and may cause the arteries to become narrow or stiff. Understanding blood pressure readings Your personal target blood pressure may vary depending on your medical conditions, your age, and other factors. A blood pressure reading includes a higher number over a lower number. Ideally, your blood pressure should be below 120/80. You should know that:  The first, or top, number is called the systolic pressure. It is a measure of the pressure in your arteries as your heart beats.  The second, or bottom number, is called the diastolic pressure. It is a measure of the pressure in your arteries as the heart relaxes. Blood pressure is classified into four stages. Based on your blood pressure reading, your health care provider may use the following stages to determine what type of treatment you need, if any. Systolic pressure and diastolic pressure are measured in a unit called mmHg. Normal  Systolic pressure: below 123456.  Diastolic pressure: below 80. Elevated  Systolic pressure: [id number].  Diastolic pressure: below 80. Hypertension stage 1  Systolic  pressure: 0000000.  Diastolic pressure: [id number]. Hypertension stage 2  Systolic pressure: [id number] or above.  Diastolic pressure: 90 or above. How can this condition affect me? Managing your hypertension is an important responsibility. Over time, hypertension can damage the arteries and decrease blood flow to important parts of the body, including the brain, heart, and kidneys. Having untreated or uncontrolled hypertension can lead to:  A heart attack.  A stroke.  A weakened blood vessel (aneurysm).  Heart failure.  Kidney damage.  Eye damage.  Metabolic syndrome.  Memory and concentration problems.  Vascular dementia. What actions can I take to manage this condition? Hypertension can be managed by making lifestyle changes and possibly by taking medicines. Your health care provider will help you make a plan to bring your blood pressure within a normal range. Nutrition  Eat a diet that is high in fiber and potassium, and low in salt (sodium), added sugar, and fat. An example eating plan is called the Dietary Approaches to Stop Hypertension (DASH) diet. To eat this way: ? Eat plenty of fresh fruits and vegetables. Try to fill one-half of your plate at each meal with fruits and vegetables. ? Eat whole grains, such as whole-wheat pasta, brown rice, or whole-grain bread. Fill about one-fourth of your plate with whole grains. ? Eat low-fat dairy products. ? Avoid fatty cuts of meat, processed or cured meats, and poultry with skin. Fill about one-fourth of your plate with lean proteins such as fish, chicken without skin, beans, eggs, and tofu. ? Avoid pre-made and processed foods. These tend  to be higher in sodium, added sugar, and fat.  Reduce your daily sodium intake. Most people with hypertension should eat less than 1,500 mg of sodium a day.   Lifestyle  Work with your health care provider to maintain a healthy body weight or to lose weight. Ask what an ideal weight is for you.  Get  at least 30 minutes of exercise that causes your heart to beat faster (aerobic exercise) most days of the week. Activities may include walking, swimming, or biking.  Include exercise to strengthen your muscles (resistance exercise), such as weight lifting, as part of your weekly exercise routine. Try to do these types of exercises for 30 minutes at least 3 days a week.  Do not use any products that contain nicotine or tobacco, such as cigarettes, e-cigarettes, and chewing tobacco. If you need help quitting, ask your health care provider.  Control any long-term (chronic) conditions you have, such as high cholesterol or diabetes.  Identify your sources of stress and find ways to manage stress. This may include meditation, deep breathing, or making time for fun activities.   Alcohol use  Do not drink alcohol if: ? Your health care provider tells you not to drink. ? You are pregnant, may be pregnant, or are planning to become pregnant.  If you drink alcohol: ? Limit how much you use to:  0-1 drink a day for women.  0-2 drinks a day for men. ? Be aware of how much alcohol is in your drink. In the [place] one drink equals one 12 oz bottle of beer (355 mL), one 5 oz glass of wine (148 mL), or one 1 oz glass of hard liquor (44 mL). Medicines Your health care provider may prescribe medicine if lifestyle changes are not enough to get your blood pressure under control and if:  Your systolic blood pressure is 130 or higher.  Your diastolic blood pressure is 80 or higher. Take medicines only as told by your health care provider. Follow the directions carefully. Blood pressure medicines must be taken as told by your health care provider. The medicine does not work as well when you skip doses. Skipping doses also puts you at risk for problems. Monitoring Before you monitor your blood pressure:  Do not smoke, drink caffeinated beverages, or exercise within 30 minutes before taking a  measurement.  Use the bathroom and empty your bladder (urinate).  Sit quietly for at least 5 minutes before taking measurements. Monitor your blood pressure at home as told by your health care provider. To do this:  Sit with your back straight and supported.  Place your feet flat on the floor. Do not cross your legs.  Support your arm on a flat surface, such as a table. Make sure your upper arm is at heart level.  Each time you measure, take two or three readings one minute apart and record the results. You may also need to have your blood pressure checked regularly by your health care provider.   General information  Talk with your health care provider about your diet, exercise habits, and other lifestyle factors that may be contributing to hypertension.  Review all the medicines you take with your health care provider because there may be side effects or interactions.  Keep all visits as told by your health care provider. Your health care provider can help you create and adjust your plan for managing your high blood pressure. Where to find more information  National Heart, Lung, and Blood  Institute: https://wilson-eaton.com/  American Heart Association: www.heart.org Contact a health care provider if:  You think you are having a reaction to medicines you have taken.  You have repeated (recurrent) headaches.  You feel dizzy.  You have swelling in your ankles.  You have trouble with your vision. Get help right away if:  You develop a severe headache or confusion.  You have unusual weakness or numbness, or you feel faint.  You have severe pain in your chest or abdomen.  You vomit repeatedly.  You have trouble breathing. These symptoms may represent a serious problem that is an emergency. Do not wait to see if the symptoms will go away. Get medical help right away. Call your local emergency services (911 in the [place] Do not drive yourself to the  hospital. Summary  Hypertension is when the force of blood pumping through your arteries is too strong. If this condition is not controlled, it may put you at risk for serious complications.  Your personal target blood pressure may vary depending on your medical conditions, your age, and other factors. For most people, a normal blood pressure is less than 120/80.  Hypertension is managed by lifestyle changes, medicines, or both.  Lifestyle changes to help manage hypertension include losing weight, eating a healthy, low-sodium diet, exercising more, stopping smoking, and limiting alcohol. This information is not intended to replace advice given to you by your health care provider. Make sure you discuss any questions you have with your health care provider. Document Revised: 09/15/2019 Document Reviewed: 07/11/2019 Elsevier Patient Education  2021 [employer].    If you have lab work done today you will be contacted with your lab results within the next 2 weeks.  If you have not heard from [place] then please contact us. The fastest way to get your results is to register for My Chart.   IF you received an x-ray today, you will receive an invoice from [facility]. Please contact [facility] at [phone] with questions or concerns regarding your invoice.   IF you received labwork today, you will receive an invoice from [facility]. Please contact [facility] at [phone] with questions or concerns regarding your invoice.   Our billing staff will not be able to assist you with questions regarding bills from these companies.  You will be contacted with the lab results as soon as they are available. The fastest way to get your results is to activate your My Chart account. Instructions are located on the last page of this paperwork. If you have not heard from [place] regarding the results in 2 weeks, please contact this office.

## 2020-09-27 LAB — BASIC METABOLIC PANEL
BUN/Creatinine Ratio: 13 (ref 10–24)
BUN: 10 mg/dL (ref 8–27)
CO2: 23 mmol/L (ref 20–29)
Calcium: 9.8 mg/dL (ref 8.6–10.2)
Chloride: 104 mmol/L (ref 96–106)
Creatinine, Ser: 0.79 mg/dL (ref 0.76–1.27)
GFR calc Af Amer: 109 mL/min/{1.73_m2} (ref >59)
GFR calc non Af Amer: 94 mL/min/{1.73_m2} (ref >59)
Glucose: 107 mg/dL — ABNORMAL HIGH (ref 65–99)
Potassium: 4.2 mmol/L (ref 3.5–5.2)
Sodium: 143 mmol/L (ref 134–144)

## 2020-11-07 ENCOUNTER — Ambulatory Visit: Payer: Medicare Other | Admitting: Family Medicine

## 2020-12-23 ENCOUNTER — Other Ambulatory Visit: Payer: Self-pay | Admitting: Family Medicine

## 2020-12-23 DIAGNOSIS — N529 Male erectile dysfunction, unspecified: Secondary | ICD-10-CM

## 2021-03-26 ENCOUNTER — Telehealth: Payer: Self-pay | Admitting: Family Medicine

## 2021-03-26 ENCOUNTER — Other Ambulatory Visit: Payer: Self-pay

## 2021-03-26 DIAGNOSIS — I10 Essential (primary) hypertension: Secondary | ICD-10-CM

## 2021-03-26 MED ORDER — AMLODIPINE BESYLATE 2.5 MG PO TABS: 2.5000 mg | ORAL_TABLET | Freq: Every day | ORAL | 3 refills | Status: DC

## 2021-03-26 NOTE — Telephone Encounter (Signed)
Medication sent to pharmacy  

## 2021-03-26 NOTE — Telephone Encounter (Signed)
..  Medication Refills  Last OV:  Medication:amlodipine 2.5  Pharmacy:[facility] - [place]  Let patient know to contact pharmacy at the end of the day to make sure medication is ready.   Please notify patient to allow 48-72 hours to process.  Encourage patient to contact the pharmacy for refills or they can request refills through [place] out below:   Last refill:  QTY:  Refill Date:    Other Comments:   Okay for refill?  Please advise.

## 2021-08-24 ENCOUNTER — Other Ambulatory Visit: Payer: Self-pay | Admitting: Family Medicine

## 2021-08-24 DIAGNOSIS — I10 Essential (primary) hypertension: Secondary | ICD-10-CM

## 2021-08-26 NOTE — Telephone Encounter (Signed)
Pt called in checking on this

## 2021-10-02 ENCOUNTER — Other Ambulatory Visit: Payer: Self-pay | Admitting: Family Medicine

## 2021-10-02 DIAGNOSIS — I10 Essential (primary) hypertension: Secondary | ICD-10-CM

## 2021-11-07 ENCOUNTER — Other Ambulatory Visit: Payer: Self-pay | Admitting: Family Medicine

## 2021-11-07 DIAGNOSIS — I10 Essential (primary) hypertension: Secondary | ICD-10-CM

## 2021-12-08 ENCOUNTER — Other Ambulatory Visit: Payer: Self-pay | Admitting: Family Medicine

## 2021-12-08 DIAGNOSIS — I10 Essential (primary) hypertension: Secondary | ICD-10-CM

## 2021-12-11 ENCOUNTER — Other Ambulatory Visit: Payer: Self-pay | Admitting: Family Medicine

## 2021-12-11 DIAGNOSIS — I10 Essential (primary) hypertension: Secondary | ICD-10-CM

## 2022-06-16 ENCOUNTER — Encounter: Payer: Self-pay | Admitting: Internal Medicine

## 2022-08-11 ENCOUNTER — Encounter: Payer: Self-pay | Admitting: Family

## 2022-08-11 ENCOUNTER — Telehealth (INDEPENDENT_AMBULATORY_CARE_PROVIDER_SITE_OTHER): Payer: Medicare Other | Admitting: Family

## 2022-08-11 VITALS — Temp 99.4°F | Wt 155.0 lb

## 2022-08-11 DIAGNOSIS — R051 Acute cough: Secondary | ICD-10-CM

## 2022-08-11 DIAGNOSIS — U071 COVID-19: Secondary | ICD-10-CM | POA: Diagnosis not present

## 2022-08-11 MED ORDER — NIRMATRELVIR/RITONAVIR (PAXLOVID)TABLET: 3.0000 | ORAL_TABLET | Freq: Two times a day (BID) | ORAL | 0 refills | Status: AC

## 2022-08-11 NOTE — Progress Notes (Signed)
Virtual Visit via Video   I connected with patient on 08/11/22 at 10:45 AM EST by a video enabled telemedicine application and verified that I am speaking with the correct person using two identifiers.  Location patient: Home Location provider: [facility] Persons participating in the virtual visit: Patient, Provider, CMA  I discussed the limitations of evaluation and management by telemedicine and the availability of in person appointments. The patient expressed understanding and agreed to proceed.  Subjective:   HPI:   66 year old male presents via video with concerns of fever, cough, congestion and loss of taste. Tested with a rapid antigen test that was positive. Wife is home sick as well. Taking Mucinex that helps. Wants Paxlovid  ROS:   See pertinent positives and negatives per HPI.  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   Thoracic spondylosis without myelopathy 02/02/2019   Low back pain radiating to left lower extremity 04/07/2018   Tobacco abuse 06/30/2013    Social History   Tobacco Use   Smoking status: Every Day    Packs/day: 1.00    Years: 25.00    Total pack years: 25.00    Types: Cigarettes   Smokeless tobacco: Never  Substance Use Topics   Alcohol use: No    Alcohol/week: 0.0 standard drinks of alcohol    Current Outpatient Medications:    cetirizine (ZYRTEC) 10 MG tablet, Take 10 mg by mouth daily., Disp: , Rfl:    nirmatrelvir/ritonavir EUA (PAXLOVID) 20 x 150 MG & 10 x '100MG'$  TABS, Take 3 tablets by mouth 2 (two) times daily for 5 days. (Take nirmatrelvir 150 mg two tablets twice daily for 5 days and ritonavir 100 mg one tablet twice daily for 5 days) Patient GFR is 94, Disp: 30 tablet, Rfl: 0    albuterol (VENTOLIN HFA) 108 (90 Base) MCG/ACT inhaler, Inhale 1-2 puffs into the lungs every 4 (four) hours as needed for wheezing or shortness of breath. (Patient not taking: Reported on 08/11/2022), Disp: 18 g, Rfl: 0   amLODipine (NORVASC) 2.5 MG tablet, Take 1 tablet by mouth once daily (Patient not taking: Reported on 08/11/2022), Disp: 30 tablet, Rfl: 0   azithromycin (ZITHROMAX) 250 MG tablet, Sig as indicated (Patient not taking: Reported on 08/11/2022), Disp: 6 tablet, Rfl: 0   clonazePAM (KLONOPIN) 0.5 MG tablet, Take 0.5 tablets (0.25 mg total) by mouth 2 (two) times daily as needed for anxiety. (Patient not taking: Reported on 08/11/2022), Disp: 20 tablet, Rfl: 0   cyclobenzaprine (FLEXERIL) 5 MG tablet, Take 0.5-1 tablets (2.5-5 mg total) by mouth 3 (three) times daily as needed for muscle spasms. (Patient not taking: Reported on 08/11/2022), Disp: 20 tablet, Rfl: 0   cyclobenzaprine (FLEXERIL) 5 MG tablet, Take by mouth., Disp: , Rfl:    fluticasone (FLONASE) 50 MCG/ACT nasal spray, Place 1 spray into both nostrils 2 (two) times daily. (Patient not taking: Reported on 08/11/2022), Disp: 16 g, Rfl: 6   sertraline (ZOLOFT) 50 MG tablet, Take 1 tablet (50 mg total) by mouth daily. (Patient not taking: Reported on 08/11/2022), Disp: 90 tablet, Rfl: 1   sildenafil (VIAGRA) 100 MG tablet, TAKE 1/2 TO 1 (ONE-HALF TO ONE) TABLET BY MOUTH ONCE DAILY AS NEEDED FOR ERECTILE DYSFUNCTION (Patient not taking: Reported on 08/11/2022), Disp:  5 tablet, Rfl: 0  Allergies  Allergen Reactions   Amoxicillin     vomiting   Prednisone Nausea And Vomiting    Objective:   Temp 99.4 F (37.4 C)   Wt 155 lb (70.3 kg)   BMI 22.24 kg/m   Patient is well-developed, well-nourished in no acute distress.  Resting comfortably at home.  Head is normocephalic, atraumatic.  No labored breathing.  Speech is clear and coherent with logical content.  Patient is alert and oriented at baseline.    Assessment  and Plan:    Kelen was seen today for covid positive.  Diagnoses and all orders for this visit:  COVID-19  Acute cough  Other orders -     nirmatrelvir/ritonavir EUA (PAXLOVID) 20 x 150 MG & 10 x '100MG'$  TABS; Take 3 tablets by mouth 2 (two) times daily for 5 days. (Take nirmatrelvir 150 mg two tablets twice daily for 5 days and ritonavir 100 mg one tablet twice daily for 5 days) Patient GFR is 94   COVID-19 isolation precautions provided. Rest, drink plenty of fluids.  Call the office if symptoms worsen or persist. Recheck a scheduled and sooner as needed.    Kennyth Arnold, FNP 08/11/2022  Time spent with the patient: 25 minutes, of which >50% was spent in obtaining information about symptoms, reviewing previous labs, evaluations, and treatments, counseling about condition (please see the discussed topics above), and developing a plan to further investigate it; had a number of questions which I addressed.

## 2022-12-04 DIAGNOSIS — N529 Male erectile dysfunction, unspecified: Secondary | ICD-10-CM | POA: Diagnosis not present

## 2022-12-04 DIAGNOSIS — F17210 Nicotine dependence, cigarettes, uncomplicated: Secondary | ICD-10-CM | POA: Diagnosis not present

## 2022-12-04 DIAGNOSIS — R030 Elevated blood-pressure reading, without diagnosis of hypertension: Secondary | ICD-10-CM | POA: Diagnosis not present

## 2022-12-04 DIAGNOSIS — F419 Anxiety disorder, unspecified: Secondary | ICD-10-CM | POA: Diagnosis not present

## 2023-04-06 DIAGNOSIS — F17210 Nicotine dependence, cigarettes, uncomplicated: Secondary | ICD-10-CM | POA: Diagnosis not present

## 2023-04-06 DIAGNOSIS — Z79899 Other long term (current) drug therapy: Secondary | ICD-10-CM | POA: Diagnosis not present

## 2023-04-06 DIAGNOSIS — F419 Anxiety disorder, unspecified: Secondary | ICD-10-CM | POA: Diagnosis not present

## 2023-04-06 DIAGNOSIS — Z0000 Encounter for general adult medical examination without abnormal findings: Secondary | ICD-10-CM | POA: Diagnosis not present

## 2023-04-06 DIAGNOSIS — Z1159 Encounter for screening for other viral diseases: Secondary | ICD-10-CM | POA: Diagnosis not present

## 2023-10-19 ENCOUNTER — Other Ambulatory Visit: Payer: Self-pay

## 2023-10-19 ENCOUNTER — Encounter (HOSPITAL_COMMUNITY): Payer: Self-pay

## 2023-10-19 ENCOUNTER — Emergency Department (HOSPITAL_COMMUNITY)
Admission: EM | Admit: 2023-10-19 | Discharge: 2023-10-20 | Disposition: A | Discharge status: Home / Self Care | Payer: Medicare Other | Attending: Emergency Medicine | Admitting: Emergency Medicine

## 2023-10-19 ENCOUNTER — Emergency Department (HOSPITAL_COMMUNITY): Payer: Medicare Other

## 2023-10-19 DIAGNOSIS — Z720 Tobacco use: Secondary | ICD-10-CM | POA: Diagnosis not present

## 2023-10-19 DIAGNOSIS — I10 Essential (primary) hypertension: Secondary | ICD-10-CM | POA: Diagnosis not present

## 2023-10-19 DIAGNOSIS — R0602 Shortness of breath: Secondary | ICD-10-CM | POA: Insufficient documentation

## 2023-10-19 DIAGNOSIS — B349 Viral infection, unspecified: Secondary | ICD-10-CM

## 2023-10-19 DIAGNOSIS — R531 Weakness: Secondary | ICD-10-CM | POA: Diagnosis present

## 2023-10-19 DIAGNOSIS — R112 Nausea with vomiting, unspecified: Secondary | ICD-10-CM | POA: Insufficient documentation

## 2023-10-19 LAB — COMPREHENSIVE METABOLIC PANEL
ALT: 13 U/L (ref 0–44)
AST: 20 U/L (ref 15–41)
Albumin: 4.1 g/dL (ref 3.5–5.0)
Alkaline Phosphatase: 104 U/L (ref 38–126)
Anion gap: 17 — ABNORMAL HIGH (ref 5–15)
BUN: 18 mg/dL (ref 8–23)
CO2: 23 mmol/L (ref 22–32)
Calcium: 9.8 mg/dL (ref 8.9–10.3)
Chloride: 101 mmol/L (ref 98–111)
Creatinine, Ser: 0.96 mg/dL (ref 0.61–1.24)
GFR, Estimated: >60 mL/min (ref >60)
Glucose, Bld: 118 mg/dL — ABNORMAL HIGH (ref 70–99)
Potassium: 4.1 mmol/L (ref 3.5–5.1)
Sodium: 141 mmol/L (ref 135–145)
Total Bilirubin: 0.7 mg/dL (ref 0.0–1.2)
Total Protein: 7.6 g/dL (ref 6.5–8.1)

## 2023-10-19 LAB — CBC
HCT: 44 % (ref 39.0–52.0)
Hemoglobin: 14.9 g/dL (ref 13.0–17.0)
MCH: 30.3 pg (ref 26.0–34.0)
MCHC: 33.9 g/dL (ref 30.0–36.0)
MCV: 89.6 fL (ref 80.0–100.0)
Platelets: 161 10*3/uL (ref 150–400)
RBC: 4.91 MIL/uL (ref 4.22–5.81)
RDW: 13.8 % (ref 11.5–15.5)
WBC: 4.5 10*3/uL (ref 4.0–10.5)
nRBC: 0 % (ref 0.0–0.2)

## 2023-10-19 LAB — RESP PANEL BY RT-PCR (RSV, FLU A&B, COVID)  RVPGX2
Influenza A by PCR: NEGATIVE
Influenza B by PCR: NEGATIVE
Resp Syncytial Virus by PCR: NEGATIVE
SARS Coronavirus 2 by RT PCR: NEGATIVE

## 2023-10-19 LAB — TROPONIN I (HIGH SENSITIVITY): Troponin I (High Sensitivity): 5 ng/L (ref <18)

## 2023-10-19 NOTE — ED Triage Notes (Signed)
 Pt is coming in for general illness, for 8 days, started with fever and weakness, and in the last few days it has progressed to vertigo type symptoms with nausea and vomiting. He did take tylenol 3hrs ago. No fever during triage.

## 2023-10-19 NOTE — ED Provider Triage Note (Signed)
 Emergency Medicine Provider Triage Evaluation Note  Luke Franco , a 68 y.o. male  was evaluated in triage.  Pt complains of "feeling like I am dying".  Patient states he has had 8 days of URI symptoms.  Endorsing positive sick contacts.  States he has had nausea, vomiting, diarrhea, abdominal pain, chest pain, shortness of breath.  Reports fevers at home.  Review of Systems  Positive:  Negative:   Physical Exam  There were no vitals taken for this visit. Gen:   Awake, no distress   Resp:  Normal effort  MSK:   Moves extremities without difficulty  Other:    Medical Decision Making  Medically screening exam initiated at 8:45 PM.  Appropriate orders placed.  Luke Franco was informed that the remainder of the evaluation will be completed by another provider, this initial triage assessment does not replace that evaluation, and the importance of remaining in the ED until their evaluation is complete.     Al Decant, PA-C 10/19/23 2047

## 2023-10-20 LAB — I-STAT VENOUS BLOOD GAS, ED
Acid-Base Excess: 3 mmol/L — ABNORMAL HIGH (ref 0.0–2.0)
Bicarbonate: 26.4 mmol/L (ref 20.0–28.0)
Calcium, Ion: 1.09 mmol/L — ABNORMAL LOW (ref 1.15–1.40)
HCT: 40 % (ref 39.0–52.0)
Hemoglobin: 13.6 g/dL (ref 13.0–17.0)
O2 Saturation: 88 %
Potassium: 5 mmol/L (ref 3.5–5.1)
Sodium: 138 mmol/L (ref 135–145)
TCO2: 27 mmol/L (ref 22–32)
pCO2, Ven: 33.9 mm[Hg] — ABNORMAL LOW (ref 44–60)
pH, Ven: 7.5 — ABNORMAL HIGH (ref 7.25–7.43)
pO2, Ven: 48 mm[Hg] — ABNORMAL HIGH (ref 32–45)

## 2023-10-20 LAB — URINALYSIS, ROUTINE W REFLEX MICROSCOPIC
Bacteria, UA: NONE SEEN
Bilirubin Urine: NEGATIVE
Glucose, UA: NEGATIVE mg/dL
Hgb urine dipstick: NEGATIVE
Ketones, ur: 80 mg/dL — AB
Leukocytes,Ua: NEGATIVE
Nitrite: NEGATIVE
Protein, ur: 100 mg/dL — AB
Specific Gravity, Urine: 1.026 (ref 1.005–1.030)
pH: 5 (ref 5.0–8.0)

## 2023-10-20 LAB — CBG MONITORING, ED: Glucose-Capillary: 104 mg/dL — ABNORMAL HIGH (ref 70–99)

## 2023-10-20 LAB — I-STAT CG4 LACTIC ACID, ED: Lactic Acid, Venous: 1.1 mmol/L (ref 0.5–1.9)

## 2023-10-20 LAB — TROPONIN I (HIGH SENSITIVITY): Troponin I (High Sensitivity): 4 ng/L (ref <18)

## 2023-10-20 MED ORDER — SODIUM CHLORIDE 0.9 % IV BOLUS
500.0000 mL | Freq: Once | INTRAVENOUS | Status: AC
  Administered 2023-10-20: 500 mL via INTRAVENOUS

## 2023-10-20 MED ORDER — ONDANSETRON HCL 4 MG/2ML IJ SOLN
4.0000 mg | Freq: Once | INTRAMUSCULAR | Status: AC
  Administered 2023-10-20: 4 mg via INTRAVENOUS
  Filled 2023-10-20: qty 2

## 2023-10-20 MED ORDER — AMLODIPINE BESYLATE 5 MG PO TABS
2.5000 mg | ORAL_TABLET | Freq: Once | ORAL | Status: AC
  Administered 2023-10-20: 2.5 mg via ORAL
  Filled 2023-10-20: qty 1

## 2023-10-20 MED ORDER — ONDANSETRON HCL 4 MG PO TABS: 4.0000 mg | ORAL_TABLET | Freq: Four times a day (QID) | ORAL | 0 refills | Status: AC

## 2023-10-20 NOTE — Discharge Instructions (Signed)
 Please follow-up with your primary care provider regards recent ER visit. Today your labs and imaging are reassuring most likely have a viral illness. Please use Tylenol every 6 hours as needed for pain. I have prescribed Zofran in case you become nauseous. Please remain hydrated and eat food as tolerated. If symptoms change or worsen please return to the ER.

## 2023-10-20 NOTE — ED Provider Notes (Addendum)
 [place] EMERGENCY DEPARTMENT AT [facility] Provider Note   CSN: [id number] Arrival date & time: 10/19/23  2008     History  No chief complaint on file.   Luke Franco is a 68 y.o. male history of tobacco use anxiety, hypertension presented for 3 days of nausea vomiting and generalized weakness.  Patient thinks he has the flu and would like IV fluids.  Patient states he was last able to eat this morning where he had a biscuit along with water.  Patient denies any fevers chest pain shortness of breath abdominal pain.  Patient states that when he stands up he feels a little woozy.  Home Medications Prior to Admission medications   Medication Sig Start Date End Date Taking? Authorizing Provider  ondansetron (ZOFRAN) 4 MG tablet Take 1 tablet (4 mg total) by mouth every 6 (six) hours. 10/20/23  Yes Jasiyah Poland, Beverly Gust, PA-C  albuterol (VENTOLIN HFA) 108 (90 Base) MCG/ACT inhaler Inhale 1-2 puffs into the lungs every 4 (four) hours as needed for wheezing or shortness of breath. Patient not taking: Reported on 08/11/2022 09/04/19   Shade Flood, MD  amLODipine (NORVASC) 2.5 MG tablet Take 1 tablet by mouth once daily Patient not taking: Reported on 08/11/2022 11/07/21   Shade Flood, MD  azithromycin [facility]) 250 MG tablet Sig as indicated Patient not taking: Reported on 08/11/2022 05/23/20   Georgina Quint, MD  cetirizine (ZYRTEC) 10 MG tablet Take 10 mg by mouth daily.    [provider]  clonazePAM (KLONOPIN) 0.5 MG tablet Take 0.5 tablets (0.25 mg total) by mouth 2 (two) times daily as needed for anxiety. Patient not taking: Reported on 08/11/2022 09/26/20   Shade Flood, MD  cyclobenzaprine (FLEXERIL) 5 MG tablet Take 0.5-1 tablets (2.5-5 mg total) by mouth 3 (three) times daily as needed for muscle spasms. Patient not taking: Reported on 08/11/2022 09/01/18   Shade Flood, MD  cyclobenzaprine (FLEXERIL) 5 MG tablet Take by mouth.    [provider]  fluticasone (FLONASE) 50 MCG/ACT nasal spray Place 1 spray into both nostrils 2 (two) times daily. Patient not taking: Reported on 08/11/2022 09/26/20   Shade Flood, MD  sertraline (ZOLOFT) 50 MG tablet Take 1 tablet (50 mg total) by mouth daily. Patient not taking: Reported on 08/11/2022 06/26/20   Shade Flood, MD  sildenafil (VIAGRA) 100 MG tablet TAKE 1/2 TO 1 (ONE-HALF TO ONE) TABLET BY MOUTH ONCE DAILY AS NEEDED FOR ERECTILE DYSFUNCTION Patient not taking: Reported on 08/11/2022 12/23/20   Shade Flood, MD      Allergies    Amoxicillin and Prednisone    Review of Systems   Review of Systems  Physical Exam Updated Vital Signs BP (!) 184/77   Pulse 60   Temp 98.9 F (37.2 C) (Oral)   Resp 18   SpO2 99%  Physical Exam Vitals reviewed.  Constitutional:      General: He is not in acute distress. HENT:     Head: Normocephalic and atraumatic.  Eyes:     Extraocular Movements: Extraocular movements intact.     Conjunctiva/sclera: Conjunctivae normal.     Pupils: Pupils are equal, round, and reactive to light.  Cardiovascular:     Rate and Rhythm: Normal rate and regular rhythm.     Pulses: Normal pulses.     Heart sounds: Normal heart sounds.     Comments: 2+ bilateral radial/dorsalis pedis pulses with regular rate Pulmonary:  Effort: Pulmonary effort is normal. No respiratory distress.     Breath sounds: Normal breath sounds.  Abdominal:     Palpations: Abdomen is soft.     Tenderness: There is no abdominal tenderness. There is no guarding or rebound.  Musculoskeletal:        General: Normal range of motion.     Cervical back: Normal range of motion and neck supple.     Comments: 5 out of 5 bilateral grip/leg extension strength  Skin:    General: Skin is warm and dry.     Capillary Refill: Capillary refill takes less than 2 seconds.  Neurological:     General: No focal deficit present.     Mental Status: He is alert and oriented to  person, place, and time.     Comments: Sensation intact in all 4 limbs  Psychiatric:        Mood and Affect: Mood normal.     ED Results / Procedures / Treatments   Labs (all labs ordered are listed, but only abnormal results are displayed) Labs Reviewed  COMPREHENSIVE METABOLIC PANEL - Abnormal; Notable for the following components:      Result Value   Glucose, Bld 118 (*)    Anion gap 17 (*)    All other components within normal limits  URINALYSIS, ROUTINE W REFLEX MICROSCOPIC - Abnormal; Notable for the following components:   APPearance HAZY (*)    Ketones, ur 80 (*)    Protein, ur 100 (*)    All other components within normal limits  RESP PANEL BY RT-PCR (RSV, FLU A&B, COVID)  RVPGX2  CBC  I-STAT CG4 LACTIC ACID, ED  TROPONIN I (HIGH SENSITIVITY)  TROPONIN I (HIGH SENSITIVITY)    EKG None  Radiology DG Chest 2 View Result Date: 10/19/2023 CLINICAL DATA:  Shortness of breath, chest pain EXAM: CHEST - 2 VIEW COMPARISON:  07/31/2013 FINDINGS: The heart size and mediastinal contours are within normal limits. Both lungs are clear. The visualized skeletal structures are unremarkable. IMPRESSION: No active cardiopulmonary disease. Electronically Signed   By: Charlett Nose M.D.   On: 10/19/2023 21:11    Procedures Procedures    Medications Ordered in ED Medications  sodium chloride 0.9 % bolus 500 mL (0 mLs Intravenous Stopped 10/20/23 1153)  ondansetron (ZOFRAN) injection 4 mg (4 mg Intravenous Given 10/20/23 1055)  amLODipine (NORVASC) tablet 2.5 mg (2.5 mg Oral Given 10/20/23 1152)  sodium chloride 0.9 % bolus 500 mL (500 mLs Intravenous New Bag/Given 10/20/23 1258)    ED Course/ Medical Decision Making/ A&P                                 Medical Decision Making Risk Prescription drug management.   DAMARKO STITELY 68 y.o. presented today for URI like symptoms. Working DDx that I considered at this time includes, but not limited to, viral illness, pharyngitis,  mono, sinusitis, electrolyte abnormality, AOM, pneumonia.  R/o DDx: pharyngitis, mono, sinusitis, electrolyte abnormality, AOM, pneumonia: these diagnoses are not consistent with patient's history, presentation, physical exam, labs/imaging findings.  Review of prior external notes: 08/11/2022 progress Notes  Unique Tests and My Independent Interpretation:  Respiratory Panel: Negative CBC: Unremarkable UA: Small amount of ketones CMP: Anion gap 17 Troponin: 5, 4 Lactic Acid: pending Chest x-ray: No acute findings VBG: Mild alkalosis 7.5 Lactic acid: Negative CBG: 104  Social Determinants of Health: none  Discussion with Independent  Historian: None  Discussion of Management of Tests: None  Risk: Medium: prescription drug management  Risk Stratification Score: None  Plan: On exam patient was in no acute distress but noted to be mildly hypertensive however patient states he missed amlodipine this morning so we will give him his morning dose.  Exam was unremarkable.  Will obtain labs and give fluids at patient's request.  Patient does not want any imaging done as he just wants the IV fluids which is reasonable at this point.  Patient's labs are also reassuring however there does appear to be an anion gap which could be from his vomiting however patient does have some months ketones and so we will get VBG to make sure patient does not have DKA.  Patient has no documented history of diabetes and so do suspect this is most likely from patient's reported emesis.  Patient is not on any SGLT2 inhibitors that would also suggest euglycemic DKA as patient's glucose is fine.  Patient after receiving Zofran and fluids feels much better and would like to go.  Patient states he emergency at home and like to be discharged after the fluids.  Still do not have the lactic acid back however patient states that he wants to be discharged without the coming back and understands the risk of this.  Do feel  this is from patient's dehydration from vomiting.  We also still do not have an EKG and nursing was made aware of this.  Patient does not want a wait for an EKG.  Patient understands the risks associated with this.  Patient does feel much better from fluids and Zofran would like to be discharged.  Patient was p.o. challenged and is safe to be discharged at this time follow-up outpatient.  Patient is not requiring any oxygen and has not required any oxygen at any point during the ED stay.  I recommended patient use Tylenol every 6 hours needed for pain remain hydrated.  Will prescribe Zofran in case patient becomes nauseous.  Recommend patient follows up with primary care provider.  At this time patient is safe to be discharged.  Patient was given return precautions. Patient stable for discharge at this time.  Patient verbalized understanding of plan.  This chart was dictated using voice recognition software.  Despite best efforts to proofread,  errors can occur which can change the documentation meaning.         Final Clinical Impression(s) / ED Diagnoses Final diagnoses:  None    Rx / DC Orders ED Discharge Orders          Ordered    ondansetron (ZOFRAN) 4 MG tablet  Every 6 hours        10/20/23 1319              Remi Deter 10/20/23 1321    [address] PA-C 10/20/23 1324    Jacalyn Lefevre, MD 10/20/23 [phone]

## 2023-10-20 NOTE — ED Notes (Signed)
 PT given a snack bag and ginger ale for po/fluid challenge.

## 2023-10-20 NOTE — ED Notes (Signed)
 PT needs a EKG. NT notified.

## 2023-10-20 NOTE — ED Notes (Signed)
 NT informed about PT needing to be put on a monitor.
